# Patient Record
Sex: Male | Born: 1950
Health system: Southern US, Community
[De-identification: ages and names within clinical notes are randomized; demographics above are authoritative.]

## PROBLEM LIST (undated history)

## (undated) DIAGNOSIS — E785 Hyperlipidemia, unspecified: Secondary | ICD-10-CM

## (undated) DIAGNOSIS — I10 Essential (primary) hypertension: Secondary | ICD-10-CM

## (undated) HISTORY — DX: Hyperlipidemia, unspecified: E78.5

---

## 2013-11-16 ENCOUNTER — Ambulatory Visit: Payer: Self-pay | Admitting: Gastroenterology

## 2017-03-19 ENCOUNTER — Emergency Department (HOSPITAL_COMMUNITY)
Admission: EM | Admit: 2017-03-19 | Discharge: 2017-03-19 | Disposition: A | Discharge status: Home / Self Care | Payer: BLUE CROSS/BLUE SHIELD | Attending: Emergency Medicine | Admitting: Emergency Medicine

## 2017-03-19 ENCOUNTER — Encounter (HOSPITAL_COMMUNITY): Payer: Self-pay | Admitting: Emergency Medicine

## 2017-03-19 ENCOUNTER — Emergency Department (HOSPITAL_COMMUNITY): Payer: BLUE CROSS/BLUE SHIELD

## 2017-03-19 DIAGNOSIS — I10 Essential (primary) hypertension: Secondary | ICD-10-CM | POA: Insufficient documentation

## 2017-03-19 DIAGNOSIS — R42 Dizziness and giddiness: Secondary | ICD-10-CM | POA: Diagnosis not present

## 2017-03-19 DIAGNOSIS — R55 Syncope and collapse: Secondary | ICD-10-CM | POA: Diagnosis present

## 2017-03-19 HISTORY — DX: Essential (primary) hypertension: I10

## 2017-03-19 LAB — URINALYSIS, ROUTINE W REFLEX MICROSCOPIC
BILIRUBIN URINE: NEGATIVE
GLUCOSE, UA: NEGATIVE mg/dL
HGB URINE DIPSTICK: NEGATIVE
Ketones, ur: NEGATIVE mg/dL
NITRITE: NEGATIVE
PROTEIN: NEGATIVE mg/dL
Specific Gravity, Urine: 1.012 (ref 1.005–1.030)
Squamous Epithelial / LPF: NONE SEEN
pH: 6 (ref 5.0–8.0)

## 2017-03-19 LAB — BASIC METABOLIC PANEL
Anion gap: 10 (ref 5–15)
BUN: 11 mg/dL (ref 6–20)
CHLORIDE: 104 mmol/L (ref 101–111)
CO2: 23 mmol/L (ref 22–32)
CREATININE: 1 mg/dL (ref 0.61–1.24)
Calcium: 9.7 mg/dL (ref 8.9–10.3)
GFR calc Af Amer: >60 mL/min (ref >60)
GLUCOSE: 101 mg/dL — AB (ref 65–99)
POTASSIUM: 3.8 mmol/L (ref 3.5–5.1)
SODIUM: 137 mmol/L (ref 135–145)

## 2017-03-19 LAB — I-STAT TROPONIN, ED: TROPONIN I, POC: 0 ng/mL (ref 0.00–0.08)

## 2017-03-19 LAB — CBC WITH DIFFERENTIAL/PLATELET
Basophils Absolute: 0 10*3/uL (ref 0.0–0.1)
Basophils Relative: 0 %
EOS ABS: 0.3 10*3/uL (ref 0.0–0.7)
EOS PCT: 4 %
HCT: 41.1 % (ref 39.0–52.0)
Hemoglobin: 14.1 g/dL (ref 13.0–17.0)
LYMPHS ABS: 1.4 10*3/uL (ref 0.7–4.0)
LYMPHS PCT: 20 %
MCH: 29.3 pg (ref 26.0–34.0)
MCHC: 34.3 g/dL (ref 30.0–36.0)
MCV: 85.4 fL (ref 78.0–100.0)
MONO ABS: 0.6 10*3/uL (ref 0.1–1.0)
Monocytes Relative: 9 %
Neutro Abs: 4.7 10*3/uL (ref 1.7–7.7)
Neutrophils Relative %: 67 %
Platelets: 208 10*3/uL (ref 150–400)
RBC: 4.81 MIL/uL (ref 4.22–5.81)
RDW: 12.6 % (ref 11.5–15.5)
WBC: 7.1 10*3/uL (ref 4.0–10.5)

## 2017-03-19 MED ORDER — MECLIZINE HCL 25 MG PO TABS
25.0000 mg | ORAL_TABLET | Freq: Once | ORAL | Status: AC
  Administered 2017-03-19: 25 mg via ORAL
  Filled 2017-03-19: qty 1

## 2017-03-19 MED ORDER — SODIUM CHLORIDE 0.9 % IV BOLUS (SEPSIS)
1000.0000 mL | Freq: Once | INTRAVENOUS | Status: AC
  Administered 2017-03-19: 1000 mL via INTRAVENOUS

## 2017-03-19 MED ORDER — MECLIZINE HCL 25 MG PO TABS: 25.0000 mg | ORAL_TABLET | Freq: Three times a day (TID) | ORAL | 0 refills | Status: DC | PRN

## 2017-03-19 MED ORDER — GADOBENATE DIMEGLUMINE 529 MG/ML IV SOLN
18.0000 mL | Freq: Once | INTRAVENOUS | Status: AC | PRN
  Administered 2017-03-19: 18 mL via INTRAVENOUS

## 2017-03-19 NOTE — ED Notes (Signed)
Pt at MRI

## 2017-03-19 NOTE — Discharge Instructions (Signed)
Your dizziness today did not appear to be from a stroke. We would like you to follow-up with the ear nose and throat team for further management. Please take the medication to help with her dizziness. Please stay hydrated and get some rest. If any symptoms change or worsen, please return to the nearest emergency department.

## 2017-03-19 NOTE — ED Notes (Signed)
Pt ambulated hall without difficulty and denied feeling dizzy

## 2017-03-19 NOTE — ED Provider Notes (Signed)
[facility]-EMERGENCY DEPT Provider Note   CSN: [id number] Arrival date & time: 03/19/17  1811     History   Chief Complaint Chief Complaint  Patient presents with  . Near Syncope    HPI Zachary Taylor is a 66 y.o. male with PMH/o HTN who presents via EMS with dizziness and near syncope. Patient reports experiencing some mild dizziness that began this afternoon; unclear at what time it started. Patient also reports that he "felt hot., almost like he was sweating" Initially after symptoms onset, he had a customer who told he looked like he didn't feel well. 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 Per EMS, witnessed noted no LOC. Patient reports that the dizziness "feels like I'm flying, like everything is upside down." He states it is worse when he moves his head. He denies any CP, SOB, vomiting. He denies any fall or trauma. He denies any recent travel, immobilization, recent surgery or history of blood clots. He states he does not smoke.   The history is provided by the patient.    No past medical history on file.  There are no active problems to display for this patient.   No past surgical history on file.     Home Medications    Prior to Admission medications   Not on File    Family History No family history on file.  Social History Social History  Substance Use Topics  . Smoking status: Not on file  . Smokeless tobacco: Not on file  . Alcohol use Not on file     Allergies   Patient has no allergy information on record.   Review of Systems Review of Systems  Constitutional: Positive for diaphoresis. Negative for chills and fever.  HENT: Negative for congestion, rhinorrhea and sore throat.   Eyes: Negative for visual disturbance.  Respiratory: Negative for cough and shortness of breath.   Cardiovascular: Negative for chest pain.  Gastrointestinal: Negative for abdominal pain, diarrhea, nausea and vomiting.    Genitourinary: Negative for dysuria and hematuria.  Musculoskeletal: Negative for back pain and neck pain.  Skin: Negative for rash.  Neurological: Positive for dizziness. Negative for weakness, numbness and headaches.  Psychiatric/Behavioral: Negative for confusion.  All other systems reviewed and are negative.    Physical Exam Updated Vital Signs BP (!) 150/101 (BP Location: Right Arm)   Pulse 88   Temp 98.3 F (36.8 C) (Oral)   Resp 17   Ht 6' (1.829 m)   Wt 81.6 kg   SpO2 100%   BMI 24.41 kg/m   Physical Exam  Constitutional: He is oriented to person, place, and time. He appears well-developed and well-nourished.  HENT:  Head: Normocephalic and atraumatic.  Mouth/Throat: Oropharynx is clear and moist and mucous membranes are normal.  Eyes: Conjunctivae and lids are normal. Pupils are equal, round, and reactive to light. Right eye exhibits nystagmus. Left eye exhibits nystagmus.  Horizontal nystagmus to the left  Neck: Full passive range of motion without pain.  Cardiovascular: Normal rate, regular rhythm, normal heart sounds and normal pulses.  Exam reveals no gallop and no friction rub.   No murmur heard. Pulmonary/Chest: Effort normal and breath sounds normal.  Abdominal: Soft. Normal appearance. There is no tenderness. There is no rigidity and no guarding.  Musculoskeletal: Normal range of motion.  Neurological: He is alert and oriented to person, place, and time.  Cranial nerves III-XII intact Follows commands, Moves all extremities  5/5 strength to BUE and BLE  Sensation grossly Normal finger to nose. No dysdiadochokinesia. No pronator drift. No slurred speech. No facial droop.   Skin: Skin is warm and dry. Capillary refill takes less than 2 seconds.  Psychiatric: He has a normal mood and affect. His speech is normal.  Nursing note and vitals reviewed.    ED Treatments / Results  Labs (all labs ordered are listed, but only abnormal results are  displayed) Labs Reviewed - No data to display  EKG  EKG Interpretation  Date/Time:  Wednesday March 19 2017 18:22:26 EDT Ventricular Rate:  77 PR Interval:    QRS Duration: 81 QT Interval:  370 QTC Calculation: 419 R Axis:   93 Text Interpretation:  Sinus rhythm Atrial premature complex Right axis deviation Borderline T abnormalities, lateral leads Borderline ST elevation, anterior leads No prior ECG for comparison No STEMI Confirmed by Rush Landmark MD, CHRISTOPHER ([id number]) on 03/19/2017 6:44:12 PM       Radiology No results found.  Procedures Procedures (including critical care time)  Medications Ordered in ED Medications - No data to display   Initial Impression / Assessment and Plan / ED Course  I have reviewed the triage vital signs and the nursing notes.  Pertinent labs & imaging results that were available during my care of the patient were reviewed by me and considered in my medical decision making (see chart for details).     66 yo M with PMH/o HTN who presents with dizziness that began this afternoon. Unclear at what time symptoms started, though further discussion with his brother reveals that patient called him this morning and told he was dizzy. Patient has never had a history of vertigo and reports feeling at his normal baseline yesterday. Concern for vertigo vs infectious etiology vs TIA/CVA. Plan to check basic labs including CBC, BMP, Trop, UA, CT head. Will give IVF and meclizine for symptomatic relief.   Labs and imaging reviewed. No clear sign of infectious etiology. CT negative for any mass or hemorrhage. Attending discussed with neuro. Will check MRI for rule out CVA..   Signed out to [facility] NP at shift change. Pending MRI and resutls    Final Clinical Impressions(s) / ED Diagnoses   Final diagnoses:  None    New Prescriptions New Prescriptions   No medications on file     Westley Foots, [place] 03/20/17 [id number] [place], [place] 03/20/17  1931    Canary Brim Tegeler, MD 03/23/17 [phone]

## 2017-06-02 IMAGING — CT CT HEAD W/O CM
4 series · 16 of 47 positions shown, 18 images · non-contrast
Comparison: None.

CLINICAL DATA: Unsteady gait and dizziness

EXAM:
CT HEAD WITHOUT CONTRAST
TECHNIQUE: Contiguous axial images were obtained from the base of the skull
through the vertex without intravenous contrast.

[Series 3: head without · axial · non-contrast · 0.42mm/px · z∈[-95,+25]mm · 7 of 34 slices shown, 9 images]
[im 5/34  brain]
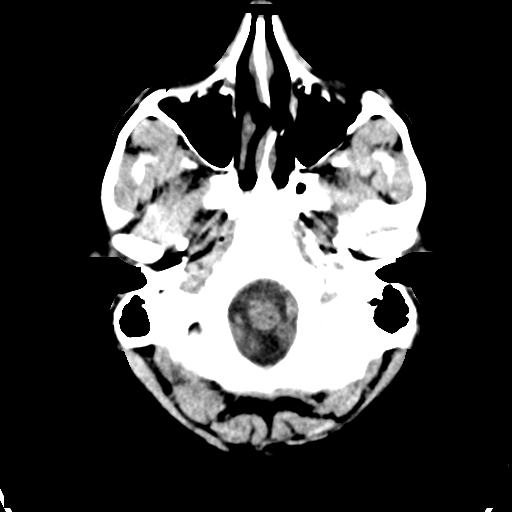
[im 5/34  bone]
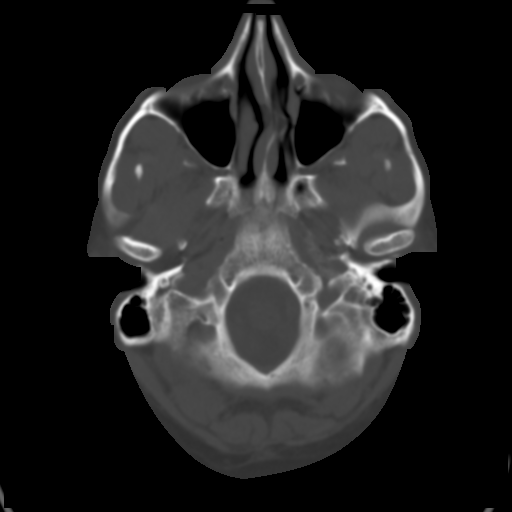
[im 9/34  brain]
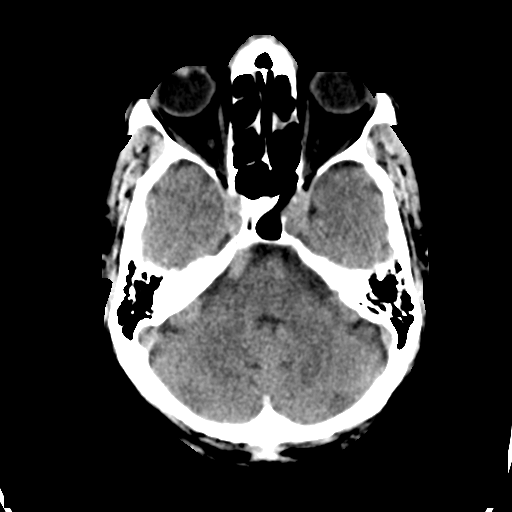
[im 13/34  brain]
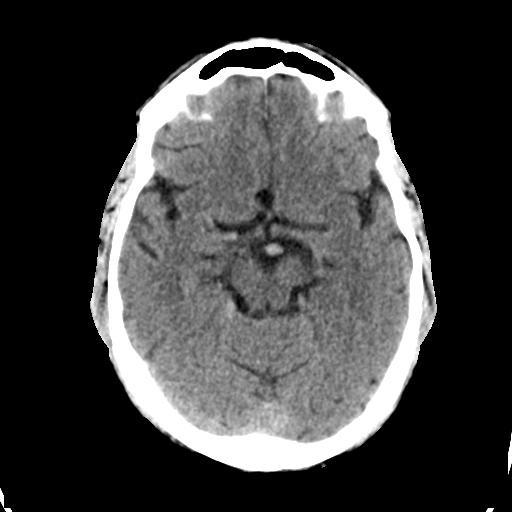
[im 17/34  brain]
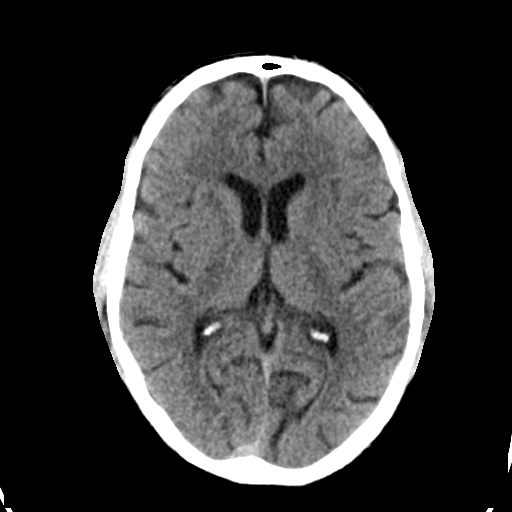
[im 21/34  brain]
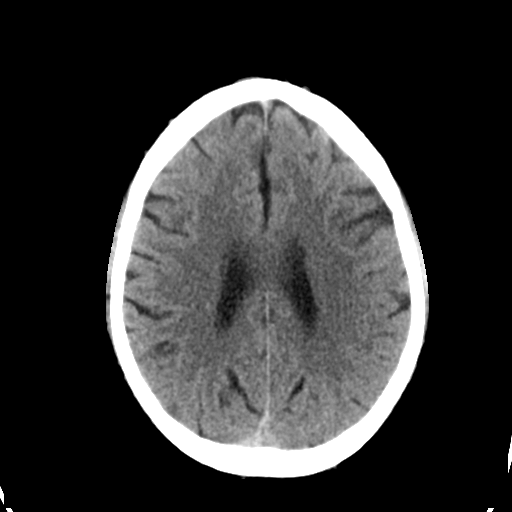
[im 21/34  bone]
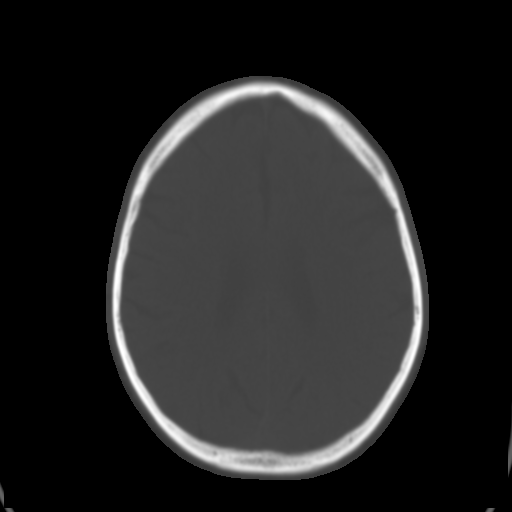
[im 25/34  brain]
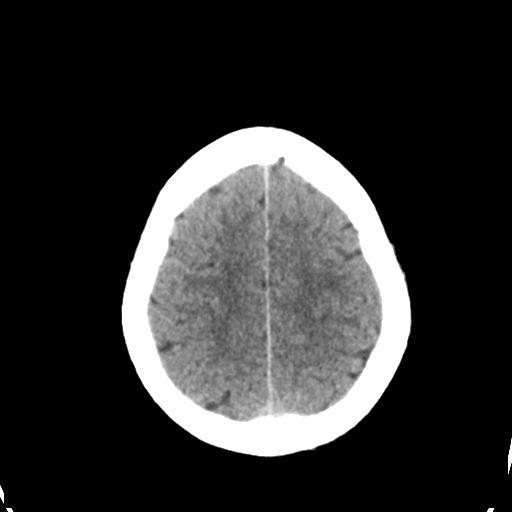
[im 29/34  brain]
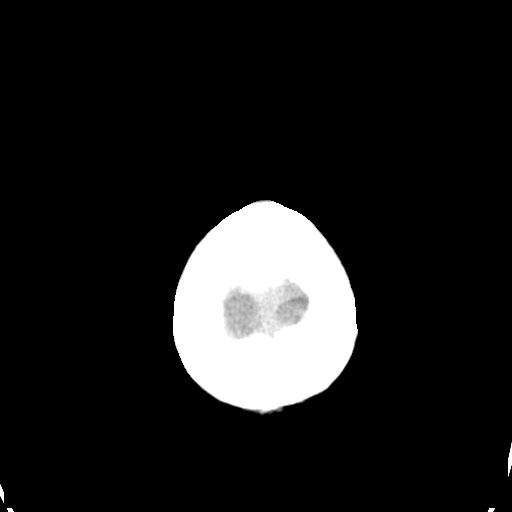

[Series 4: head bone · axial · 0.42mm/px · z∈[-99,-67]mm · 3 of 83 slices shown]
[im 9/83  bone]
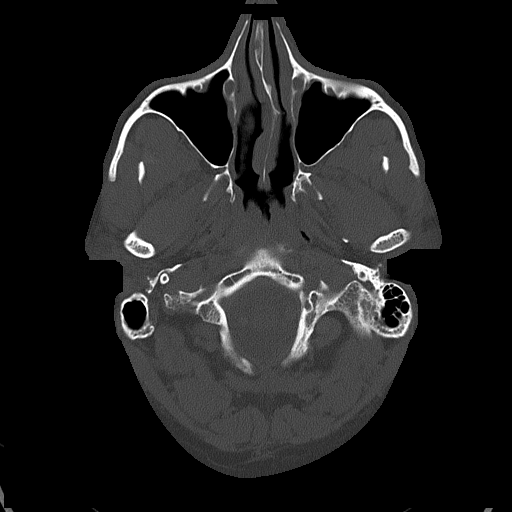
[im 17/83  bone]
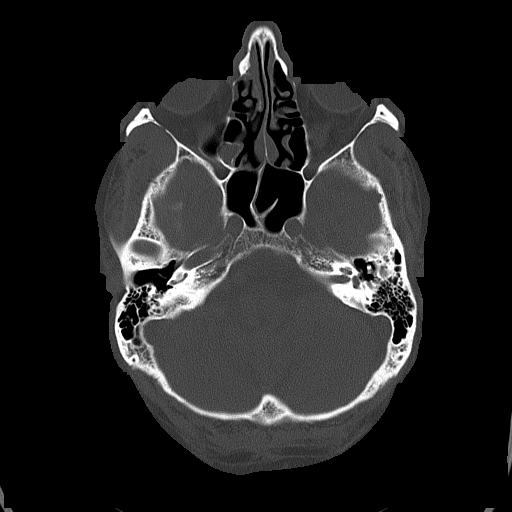
[im 25/83  bone]
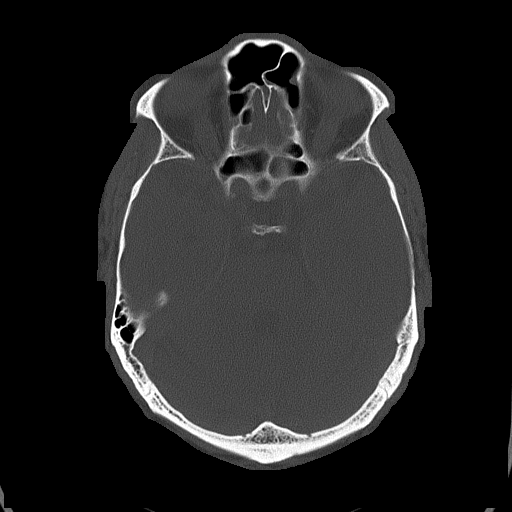

[Series 5: head without cor · coronal · non-contrast · 0.32mm/px · 3 of 71 slices shown]
[im 24/71  brain]
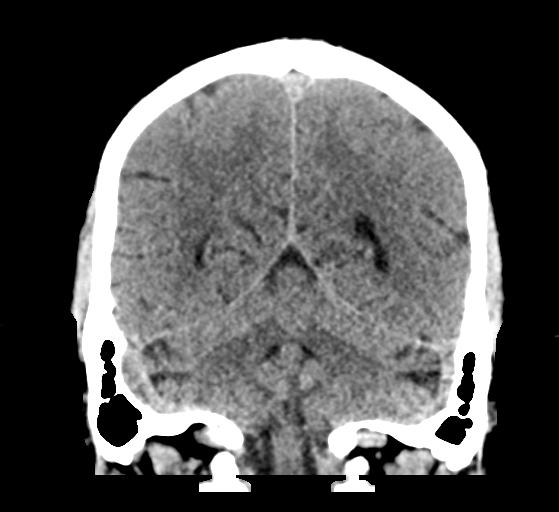
[im 32/71  brain]
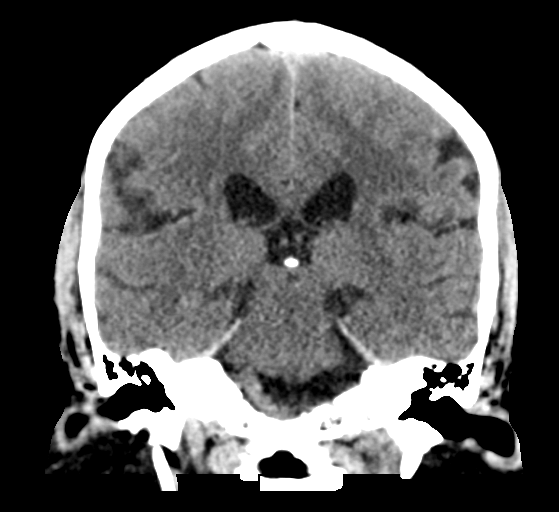
[im 39/71  brain]
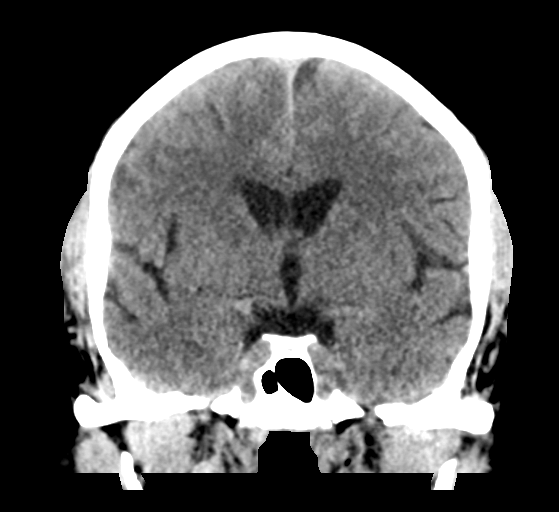

[Series 6: head without sag · sagittal · non-contrast · 0.31mm/px · 3 of 62 slices shown]
[im 21/62  brain]
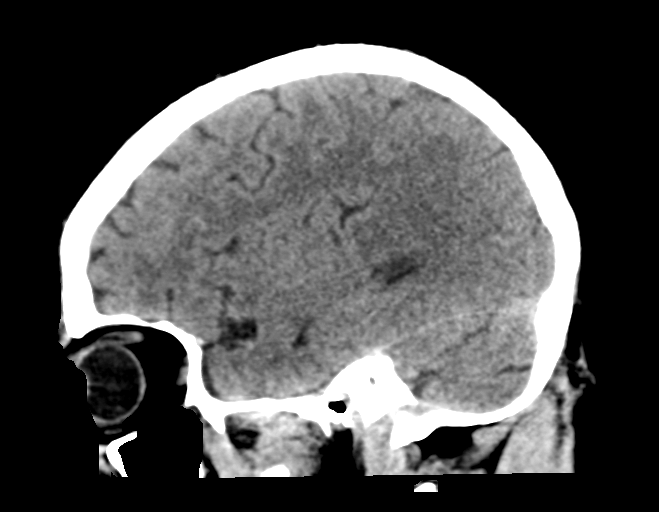
[im 31/62  brain]
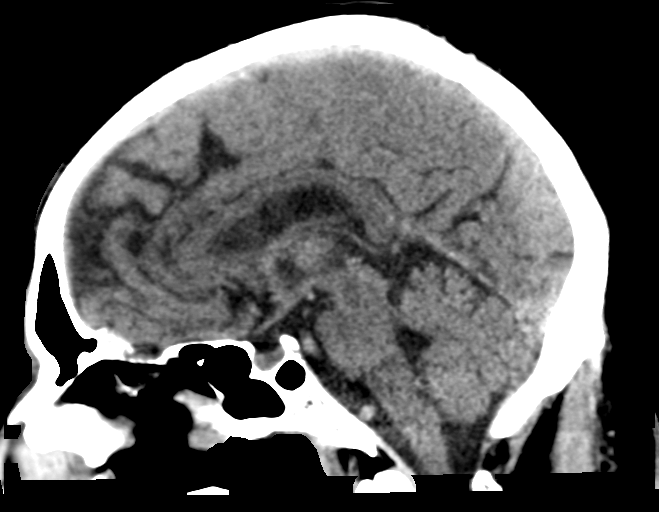
[im 41/62  brain]
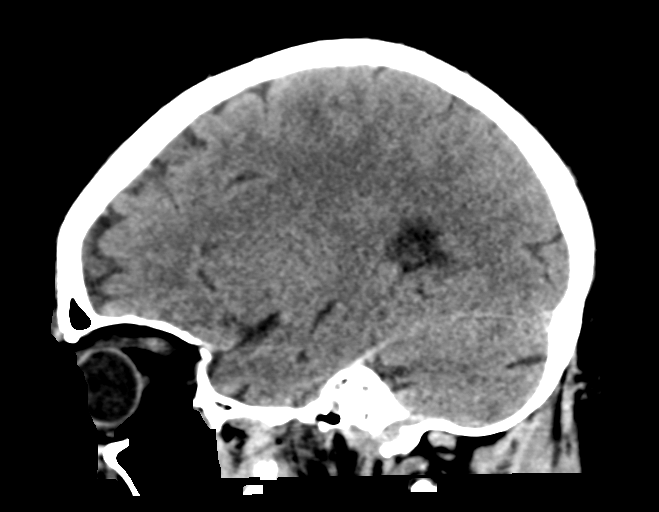

[16 of 47 positions shown; findings below may reference images not displayed]

FINDINGS: Brain: The ventricles are in the midline without mass effect or
shift. They are normal in size and configuration for age. No
extra-axial fluid collections are identified. No CT findings for
acute hemispheric infarction or intracranial hemorrhage. No mass
lesions. The brainstem and cerebellum are unremarkable.

Vascular: No hyperdense vessels or aneurysm.

Skull: No skull fracture or bone lesion.

Sinuses/Orbits: Scattered ethmoid sinus disease. The mastoid air
cells and middle ear cavities are clear.

Other: No scalp lesions or hematoma.
IMPRESSION: No acute intracranial findings or mass lesions.

## 2018-03-18 DIAGNOSIS — M659 Synovitis and tenosynovitis, unspecified: Secondary | ICD-10-CM | POA: Diagnosis not present

## 2018-03-18 DIAGNOSIS — F5101 Primary insomnia: Secondary | ICD-10-CM | POA: Diagnosis not present

## 2018-03-18 DIAGNOSIS — I10 Essential (primary) hypertension: Secondary | ICD-10-CM | POA: Diagnosis not present

## 2018-04-03 DIAGNOSIS — M654 Radial styloid tenosynovitis [de Quervain]: Secondary | ICD-10-CM | POA: Diagnosis not present

## 2018-04-06 DIAGNOSIS — M654 Radial styloid tenosynovitis [de Quervain]: Secondary | ICD-10-CM | POA: Diagnosis not present

## 2018-05-14 DIAGNOSIS — M654 Radial styloid tenosynovitis [de Quervain]: Secondary | ICD-10-CM | POA: Diagnosis not present

## 2018-11-05 DIAGNOSIS — I10 Essential (primary) hypertension: Secondary | ICD-10-CM | POA: Diagnosis not present

## 2018-11-05 DIAGNOSIS — Z0000 Encounter for general adult medical examination without abnormal findings: Secondary | ICD-10-CM | POA: Diagnosis not present

## 2018-11-05 DIAGNOSIS — R42 Dizziness and giddiness: Secondary | ICD-10-CM | POA: Diagnosis not present

## 2018-11-05 DIAGNOSIS — F5101 Primary insomnia: Secondary | ICD-10-CM | POA: Diagnosis not present

## 2018-11-09 DIAGNOSIS — R42 Dizziness and giddiness: Secondary | ICD-10-CM | POA: Diagnosis not present

## 2018-11-09 DIAGNOSIS — R531 Weakness: Secondary | ICD-10-CM | POA: Diagnosis not present

## 2018-11-09 DIAGNOSIS — E119 Type 2 diabetes mellitus without complications: Secondary | ICD-10-CM | POA: Diagnosis not present

## 2018-11-09 DIAGNOSIS — E7849 Other hyperlipidemia: Secondary | ICD-10-CM | POA: Diagnosis not present

## 2018-11-09 DIAGNOSIS — E034 Atrophy of thyroid (acquired): Secondary | ICD-10-CM | POA: Diagnosis not present

## 2018-11-09 DIAGNOSIS — R5381 Other malaise: Secondary | ICD-10-CM | POA: Diagnosis not present

## 2019-02-01 DIAGNOSIS — M659 Synovitis and tenosynovitis, unspecified: Secondary | ICD-10-CM | POA: Diagnosis not present

## 2019-02-01 DIAGNOSIS — R42 Dizziness and giddiness: Secondary | ICD-10-CM | POA: Diagnosis not present

## 2019-02-01 DIAGNOSIS — F5101 Primary insomnia: Secondary | ICD-10-CM | POA: Diagnosis not present

## 2019-02-01 DIAGNOSIS — I10 Essential (primary) hypertension: Secondary | ICD-10-CM | POA: Diagnosis not present

## 2019-06-10 DIAGNOSIS — I10 Essential (primary) hypertension: Secondary | ICD-10-CM | POA: Diagnosis not present

## 2019-06-10 DIAGNOSIS — F5101 Primary insomnia: Secondary | ICD-10-CM | POA: Diagnosis not present

## 2019-06-10 DIAGNOSIS — L2081 Atopic neurodermatitis: Secondary | ICD-10-CM | POA: Diagnosis not present

## 2019-06-10 DIAGNOSIS — M659 Synovitis and tenosynovitis, unspecified: Secondary | ICD-10-CM | POA: Diagnosis not present

## 2019-07-12 DIAGNOSIS — F5101 Primary insomnia: Secondary | ICD-10-CM | POA: Diagnosis not present

## 2019-07-12 DIAGNOSIS — L2081 Atopic neurodermatitis: Secondary | ICD-10-CM | POA: Diagnosis not present

## 2019-07-12 DIAGNOSIS — G5600 Carpal tunnel syndrome, unspecified upper limb: Secondary | ICD-10-CM | POA: Diagnosis not present

## 2019-07-12 DIAGNOSIS — M659 Synovitis and tenosynovitis, unspecified: Secondary | ICD-10-CM | POA: Diagnosis not present

## 2019-07-14 DIAGNOSIS — R42 Dizziness and giddiness: Secondary | ICD-10-CM | POA: Diagnosis not present

## 2019-11-05 DIAGNOSIS — Z03818 Encounter for observation for suspected exposure to other biological agents ruled out: Secondary | ICD-10-CM | POA: Diagnosis not present

## 2020-05-01 ENCOUNTER — Other Ambulatory Visit: Payer: Self-pay | Admitting: Internal Medicine

## 2020-05-08 ENCOUNTER — Telehealth: Payer: Self-pay | Admitting: *Deleted

## 2020-05-08 MED ORDER — MELOXICAM 15 MG PO TABS: 15.0000 mg | ORAL_TABLET | Freq: Every day | ORAL | 3 refills | Status: DC

## 2020-05-08 MED ORDER — GABAPENTIN 300 MG PO CAPS: 100.0000 mg | ORAL_CAPSULE | Freq: Three times a day (TID) | ORAL | 3 refills | Status: DC

## 2020-05-08 NOTE — Telephone Encounter (Signed)
Patient called and spoke with Dr. Juel Burrow and requested refill on Gabapentin and meloxicam. Per Dr. Juel Burrow okay to fill both with 3 refills. Rxs sent to pharmacy.

## 2020-05-19 ENCOUNTER — Encounter: Payer: Self-pay | Admitting: Internal Medicine

## 2020-05-19 ENCOUNTER — Other Ambulatory Visit: Payer: Self-pay

## 2020-05-19 ENCOUNTER — Ambulatory Visit (INDEPENDENT_AMBULATORY_CARE_PROVIDER_SITE_OTHER): Payer: Medicare HMO | Admitting: Internal Medicine

## 2020-05-19 VITALS — BP 148/78 | HR 101 | Wt 203.1 lb

## 2020-05-19 DIAGNOSIS — Z0000 Encounter for general adult medical examination without abnormal findings: Secondary | ICD-10-CM

## 2020-05-19 DIAGNOSIS — N529 Male erectile dysfunction, unspecified: Secondary | ICD-10-CM

## 2020-05-19 DIAGNOSIS — Z1211 Encounter for screening for malignant neoplasm of colon: Secondary | ICD-10-CM | POA: Diagnosis not present

## 2020-05-19 NOTE — Addendum Note (Signed)
Addended by: Corky Downs on: 05/19/2020 06:32 PM   Modules accepted: Level of Service

## 2020-05-19 NOTE — Progress Notes (Addendum)
Established Patient Office Visit  Subjective:  Patient ID: Zachary Taylor, male    DOB: 10-05-1951  Age: 69 y.o. MRN: [id number]  CC: No chief complaint on file.   HPI  Zachary Taylor presents for general medical checkup.  Denies chest pain or shortness of breath.  Past Medical History:  Diagnosis Date  . Hypertension     History reviewed. No pertinent surgical history.  History reviewed. No pertinent family history.  Social History   Socioeconomic History  . Marital status: Married    Spouse name: Not on file  . Number of children: Not on file  . Years of education: Not on file  . Highest education level: Not on file  Occupational History  . Not on file  Tobacco Use  . Smoking status: Never Smoker  . Smokeless tobacco: Never Used  Substance and Sexual Activity  . Alcohol use: No  . Drug use: No  . Sexual activity: Not on file  Other Topics Concern  . Not on file  Social History Narrative  . Not on file   Social Determinants of Health   Financial Resource Strain:   . Difficulty of Paying Living Expenses:   Food Insecurity:   . Worried About Programme researcher, broadcasting/film/video in the Last Year:   . Barista in the Last Year:   Transportation Needs:   . Freight forwarder (Medical):   Marland Kitchen Lack of Transportation (Non-Medical):   Physical Activity:   . Days of Exercise per Week:   . Minutes of Exercise per Session:   Stress:   . Feeling of Stress :   Social Connections:   . Frequency of Communication with Friends and Family:   . Frequency of Social Gatherings with Friends and Family:   . Attends Religious Services:   . Active Member of Clubs or Organizations:   . Attends Banker Meetings:   Marland Kitchen Marital Status:   Intimate Partner Violence:   . Fear of Current or Ex-Partner:   . Emotionally Abused:   Marland Kitchen Physically Abused:   . Sexually Abused:      Current Outpatient Medications:  .  gabapentin (NEURONTIN) 300 MG capsule, Take 1 capsule (300 mg total) by  mouth 3 (three) times daily., Disp: 90 capsule, Rfl: 3   No Known Allergies  ROS Review of Systems  Constitutional: Negative.   HENT: Negative.   Eyes: Negative.   Respiratory: Negative.   Cardiovascular: Negative.   Gastrointestinal: Negative.   Endocrine: Negative.   Genitourinary: Negative.   Musculoskeletal: Negative.   Allergic/Immunologic: Negative.   Neurological: Negative.   Hematological: Negative.   Psychiatric/Behavioral: Negative.       Objective:    Physical Exam  Constitutional: He is oriented to person, place, and time. He appears well-developed and well-nourished.  Eyes: Pupils are equal, round, and reactive to light.  Cardiovascular: Normal rate and regular rhythm.  Pulmonary/Chest: Effort normal and breath sounds normal.  Abdominal: Soft. Bowel sounds are normal.  Genitourinary:    Prostate normal.  Rectum:     No tenderness.   Musculoskeletal:        General: Normal range of motion.     Cervical back: Normal range of motion.  Neurological: He is alert and oriented to person, place, and time. No cranial nerve deficit. Coordination normal.  Skin: Skin is warm.  Psychiatric: He has a normal mood and affect.    BP (!) 148/78   Pulse (!) 101   Wt  203 lb 1.6 oz (92.1 kg)   BMI 27.55 kg/m  Wt Readings from Last 3 Encounters:  05/19/20 203 lb 1.6 oz (92.1 kg)  03/19/17 180 lb (81.6 kg)     Health Maintenance Due  Topic Date Due  . Hepatitis C Screening  Never done  . COVID-19 Vaccine (1) Never done  . TETANUS/TDAP  Never done  . COLONOSCOPY  Never done  . PNA vac Low Risk Adult (1 of 2 - PCV13) Never done    There are no preventive care reminders to display for this patient.  Lab Results  Component Value Date   TSH 2.11 05/23/2020   Lab Results  Component Value Date   WBC 4.8 05/23/2020   HGB 12.0 (L) 05/23/2020   HCT 38.4 (L) 05/23/2020   MCV 83.3 05/23/2020   PLT 270 05/23/2020   Lab Results  Component Value Date   NA 139  05/23/2020   K 4.6 05/23/2020   CO2 24 05/23/2020   GLUCOSE 108 (H) 05/23/2020   BUN 18 05/23/2020   CREATININE 1.04 05/23/2020   BILITOT 0.7 05/23/2020   AST 21 05/23/2020   ALT 21 05/23/2020   PROT 6.9 05/23/2020   CALCIUM 9.5 05/23/2020   ANIONGAP 10 03/19/2017   No results found for: CHOL No results found for: HDL No results found for: LDLCALC No results found for: TRIG No results found for: CHOLHDL No results found for: HGBA1C    Assessment & Plan:   Problem List Items Addressed This Visit    None    Visit Diagnoses    Screening for colon cancer    -  Primary   Relevant Orders   CT VIRTUAL COLONOSCOPY SCREENING   Erectile dysfunction, unspecified erectile dysfunction type       Relevant Orders   Testosterone   CBC with Differential/Platelet (Completed)   COMPLETE METABOLIC PANEL WITH GFR (Completed)   Lipid Panel With LDL/HDL Ratio   TSH (Completed)   PSA (Completed)   Testosterone (Completed)   Annual physical exam       Relevant Orders   PSA   TSH   Lipid Panel With LDL/HDL Ratio   COMPLETE METABOLIC PANEL WITH GFR   CBC with Differential/Platelet   CBC with Differential/Platelet (Completed)   COMPLETE METABOLIC PANEL WITH GFR (Completed)   Lipid Panel With LDL/HDL Ratio   TSH (Completed)   PSA (Completed)   Testosterone (Completed)   Routine general medical examination at a health care facility          No orders of the defined types were placed in this encounter.  1. Screening for colon cancer Rectal examination was negative. - CT VIRTUAL COLONOSCOPY SCREENING; Future  2. Erectile dysfunction, unspecified erectile dysfunction type We will check testosterone level. - Testosterone  3. Annual physical exam Annual physical exam is normal.  Patient is overweight so he was advised to lose weight. - PSA - TSH - Lipid Panel With LDL/HDL Ratio - COMPLETE METABOLIC PANEL WITH GFR - CBC with Differential/Platelet  Follow-up: Return in about 5  years (around 05/19/2025).    Cletis Athens, MD

## 2020-05-23 ENCOUNTER — Other Ambulatory Visit: Payer: Self-pay

## 2020-05-23 ENCOUNTER — Other Ambulatory Visit (INDEPENDENT_AMBULATORY_CARE_PROVIDER_SITE_OTHER): Payer: Medicare HMO

## 2020-05-23 DIAGNOSIS — Z0000 Encounter for general adult medical examination without abnormal findings: Secondary | ICD-10-CM

## 2020-05-23 DIAGNOSIS — N529 Male erectile dysfunction, unspecified: Secondary | ICD-10-CM | POA: Diagnosis not present

## 2020-05-23 LAB — COMPLETE METABOLIC PANEL WITH GFR
AG Ratio: 1.7 (calc) (ref 1.0–2.5)
ALT: 21 U/L (ref 9–46)
AST: 21 U/L (ref 10–35)
Albumin: 4.3 g/dL (ref 3.6–5.1)
Alkaline phosphatase (APISO): 52 U/L (ref 35–144)
BUN: 18 mg/dL (ref 7–25)
CO2: 24 mmol/L (ref 20–32)
Calcium: 9.5 mg/dL (ref 8.6–10.3)
Chloride: 105 mmol/L (ref 98–110)
Creat: 1.04 mg/dL (ref 0.70–1.25)
GFR, Est African American: 86 mL/min/{1.73_m2} (ref >=60)
GFR, Est Non African American: 74 mL/min/{1.73_m2} (ref >=60)
Globulin: 2.6 g/dL (calc) (ref 1.9–3.7)
Glucose, Bld: 108 mg/dL — ABNORMAL HIGH (ref 65–99)
Potassium: 4.6 mmol/L (ref 3.5–5.3)
Sodium: 139 mmol/L (ref 135–146)
Total Bilirubin: 0.7 mg/dL (ref 0.2–1.2)
Total Protein: 6.9 g/dL (ref 6.1–8.1)

## 2020-05-23 LAB — CBC WITH DIFFERENTIAL/PLATELET
Absolute Monocytes: 504 cells/uL (ref 200–950)
Basophils Absolute: 38 cells/uL (ref 0–200)
Basophils Relative: 0.8 %
Eosinophils Absolute: 259 cells/uL (ref 15–500)
Eosinophils Relative: 5.4 %
HCT: 38.4 % — ABNORMAL LOW (ref 38.5–50.0)
Hemoglobin: 12 g/dL — ABNORMAL LOW (ref 13.2–17.1)
Lymphs Abs: 1258 cells/uL (ref 850–3900)
MCH: 26 pg — ABNORMAL LOW (ref 27.0–33.0)
MCHC: 31.3 g/dL — ABNORMAL LOW (ref 32.0–36.0)
MCV: 83.3 fL (ref 80.0–100.0)
MPV: 11.3 fL (ref 7.5–12.5)
Monocytes Relative: 10.5 %
Neutro Abs: 2741 cells/uL (ref 1500–7800)
Neutrophils Relative %: 57.1 %
Platelets: 270 10*3/uL (ref 140–400)
RBC: 4.61 10*6/uL (ref 4.20–5.80)
RDW: 13.1 % (ref 11.0–15.0)
Total Lymphocyte: 26.2 %
WBC: 4.8 10*3/uL (ref 3.8–10.8)

## 2020-05-23 LAB — TESTOSTERONE: Testosterone: 385 ng/dL (ref 250–827)

## 2020-05-23 LAB — TSH: TSH: 2.11 mIU/L (ref 0.40–4.50)

## 2020-05-23 LAB — PSA: PSA: 1.9 ng/mL (ref <=4.0)

## 2020-05-23 NOTE — Addendum Note (Signed)
Addended by: Jobie Quaker on: 05/23/2020 09:23 AM   Modules accepted: Orders

## 2020-05-30 ENCOUNTER — Encounter: Payer: Self-pay | Admitting: Internal Medicine

## 2020-05-30 NOTE — Addendum Note (Signed)
Addended by: Corky Downs on: 05/30/2020 03:45 PM   Modules accepted: Level of Service

## 2020-06-09 ENCOUNTER — Other Ambulatory Visit: Payer: Self-pay | Admitting: *Deleted

## 2020-06-09 DIAGNOSIS — Z1211 Encounter for screening for malignant neoplasm of colon: Secondary | ICD-10-CM

## 2020-07-04 ENCOUNTER — Other Ambulatory Visit: Payer: Self-pay | Admitting: *Deleted

## 2020-07-04 DIAGNOSIS — Z1211 Encounter for screening for malignant neoplasm of colon: Secondary | ICD-10-CM

## 2020-07-12 ENCOUNTER — Encounter: Payer: Self-pay | Admitting: *Deleted

## 2020-08-01 ENCOUNTER — Other Ambulatory Visit: Payer: Self-pay | Admitting: Internal Medicine

## 2020-09-11 ENCOUNTER — Other Ambulatory Visit: Payer: Self-pay | Admitting: *Deleted

## 2020-09-11 MED ORDER — CYCLOBENZAPRINE HCL 10 MG PO TABS: 10.0000 mg | ORAL_TABLET | Freq: Two times a day (BID) | ORAL | 0 refills | Status: DC | PRN

## 2020-09-11 MED ORDER — CYCLOBENZAPRINE HCL 10 MG PO TABS
10.0000 mg | ORAL_TABLET | Freq: Two times a day (BID) | ORAL | 0 refills | Status: DC | PRN
Start: 2020-09-11 — End: 2020-09-11

## 2020-09-11 NOTE — Addendum Note (Signed)
Addended by: Jobie Quaker on: 09/11/2020 04:35 PM   Modules accepted: Orders

## 2020-09-29 ENCOUNTER — Other Ambulatory Visit: Payer: Self-pay

## 2020-10-03 ENCOUNTER — Ambulatory Visit (INDEPENDENT_AMBULATORY_CARE_PROVIDER_SITE_OTHER): Payer: Medicare HMO | Admitting: Internal Medicine

## 2020-10-03 ENCOUNTER — Encounter: Payer: Self-pay | Admitting: Internal Medicine

## 2020-10-03 ENCOUNTER — Other Ambulatory Visit: Payer: Self-pay | Admitting: Internal Medicine

## 2020-10-03 ENCOUNTER — Other Ambulatory Visit: Payer: Self-pay

## 2020-10-03 VITALS — BP 142/95 | HR 107 | Ht 70.0 in | Wt 211.5 lb

## 2020-10-03 DIAGNOSIS — J029 Acute pharyngitis, unspecified: Secondary | ICD-10-CM | POA: Insufficient documentation

## 2020-10-03 DIAGNOSIS — Z20822 Contact with and (suspected) exposure to covid-19: Secondary | ICD-10-CM | POA: Diagnosis not present

## 2020-10-03 DIAGNOSIS — I10 Essential (primary) hypertension: Secondary | ICD-10-CM | POA: Diagnosis not present

## 2020-10-03 DIAGNOSIS — G629 Polyneuropathy, unspecified: Secondary | ICD-10-CM | POA: Diagnosis not present

## 2020-10-03 DIAGNOSIS — N529 Male erectile dysfunction, unspecified: Secondary | ICD-10-CM | POA: Diagnosis not present

## 2020-10-03 DIAGNOSIS — J028 Acute pharyngitis due to other specified organisms: Secondary | ICD-10-CM

## 2020-10-03 DIAGNOSIS — E8881 Metabolic syndrome: Secondary | ICD-10-CM

## 2020-10-03 LAB — POC COVID19 BINAXNOW: SARS Coronavirus 2 Ag: NEGATIVE

## 2020-10-03 MED ORDER — AZITHROMYCIN 250 MG PO TABS: ORAL_TABLET | ORAL | 0 refills | Status: DC

## 2020-10-03 MED ORDER — PREGABALIN 50 MG PO CAPS: 50.0000 mg | ORAL_CAPSULE | Freq: Three times a day (TID) | ORAL | 1 refills | Status: DC

## 2020-10-03 NOTE — Assessment & Plan Note (Addendum)
Patient complains of myalgia muscle ache.  His neurological examination was normal.  Neck was supple.  His MRI and CT scan in the past were reviewed.  No abnormality was detected.  His overall reflexes are slightly decreased vibration sense was slightly decreased in the lower legs as compared with the forehead.  Sensation was normal, touch and pain proprioception was normal.  Patient was started on Lyrica 50 mg p.o. 3 times a day.  He was also told to have blood drawn for B12 level.  He was advised not to drink city water.  We will also check Fana, serum protein electrophoresis.  He was advised to get a neuro evaluation to rule out neuropathy.

## 2020-10-03 NOTE — Assessment & Plan Note (Signed)
-   Today, the patient's blood pressure is well managed on losartan. - The patient will continue the current treatment regimen.  - I encouraged the patient to eat a low-sodium diet to help control blood pressure. - I encouraged the patient to live an active lifestyle and complete activities that increases heart rate to 85% target heart rate at least 5 times per week for one hour.     

## 2020-10-03 NOTE — Assessment & Plan Note (Signed)
Advised to use Viagra or Viagra like products.  Testosterone level was okay.

## 2020-10-03 NOTE — Assessment & Plan Note (Signed)
We performed Covid test on the patient.  It was negative.  Patient throat was congested and reddish looking.  Started on Z-Pak as directed patient was also advised to take saline gargles 3-4 times a day.

## 2020-10-03 NOTE — Assessment & Plan Note (Signed)
Patient was advised to lose weight 

## 2020-10-03 NOTE — Assessment & Plan Note (Signed)
Covid test was negative in the office

## 2020-10-03 NOTE — Progress Notes (Signed)
Established Patient Office Visit  SUBJECTIVE:  Subjective  Patient ID: Zachary Taylor, male    DOB: 06/14/1951  Age: 69 y.o. MRN: [id number]  CC:  Chief Complaint  Patient presents with  . Sore Throat  . Cough  . Leg Pain    HPI Zachary Taylor is a 69 y.o. male presenting today for an evaluation of his sore throat, cough, and leg pain.   Ref Range & Units 10/12/2020 15:38  SARS Coronavirus 2 Ag Negative Negative    He has a sore throat and a cough, which developed about a week ago.  He has been gargling with salt water without much relief.  He does not have any fever chills lymphadenopathy.  There is no history of infection to nearby relatives.  He also complains of leg pain for which he treats with gabapentin.  I gave him muscle relaxant in the past without much relief.   Past Medical History:  Diagnosis Date  . Hypertension     History reviewed. No pertinent surgical history.  History reviewed. No pertinent family history.  Social History   Socioeconomic History  . Marital status: Married    Spouse name: Not on file  . Number of children: Not on file  . Years of education: Not on file  . Highest education level: Not on file  Occupational History  . Not on file  Tobacco Use  . Smoking status: Never Smoker  . Smokeless tobacco: Never Used  Substance and Sexual Activity  . Alcohol use: No  . Drug use: No  . Sexual activity: Not on file  Other Topics Concern  . Not on file  Social History Narrative  . Not on file   Social Determinants of Health   Financial Resource Strain:   . Difficulty of Paying Living Expenses: Not on file  Food Insecurity:   . Worried About Programme researcher, broadcasting/film/videounning Out of Food in the Last Year: Not on file  . Ran Out of Food in the Last Year: Not on file  Transportation Needs:   . Lack of Transportation (Medical): Not on file  . Lack of Transportation (Non-Medical): Not on file  Physical Activity:   . Days of Exercise per Week: Not on file  . Minutes of  Exercise per Session: Not on file  Stress:   . Feeling of Stress : Not on file  Social Connections:   . Frequency of Communication with Friends and Family: Not on file  . Frequency of Social Gatherings with Friends and Family: Not on file  . Attends Religious Services: Not on file  . Active Member of Clubs or Organizations: Not on file  . Attends BankerClub or Organization Meetings: Not on file  . Marital Status: Not on file  Intimate Partner Violence:   . Fear of Current or Ex-Partner: Not on file  . Emotionally Abused: Not on file  . Physically Abused: Not on file  . Sexually Abused: Not on file     Current Outpatient Medications:  .  cyclobenzaprine (FLEXERIL) 10 MG tablet, Take 1 tablet (10 mg total) by mouth 2 (two) times daily as needed for muscle spasms., Disp: 60 tablet, Rfl: 0 .  losartan-hydrochlorothiazide (HYZAAR) 100-25 MG tablet, Take 1 tablet by mouth once daily, Disp: 90 tablet, Rfl: 0 .  azithromycin (ZITHROMAX) 250 MG tablet, 2 tab po daily  For 3 days, Disp: 6 tablet, Rfl: 0 .  pregabalin (LYRICA) 50 MG capsule, Take 1 capsule (50 mg total) by mouth 3 (three) times  daily., Disp: 90 capsule, Rfl: 1   No Known Allergies  ROS Review of Systems  Constitutional: Positive for fatigue.  HENT: Positive for sore throat.   Eyes: Negative.   Respiratory: Positive for cough.   Cardiovascular: Negative.  Negative for chest pain.  Gastrointestinal: Negative.   Endocrine: Negative.   Genitourinary: Negative.   Musculoskeletal: Positive for myalgias.  Skin: Negative.   Allergic/Immunologic: Negative.   Neurological: Negative.   Hematological: Negative.   Psychiatric/Behavioral: Negative.   All other systems reviewed and are negative.    OBJECTIVE:    Physical Exam Vitals reviewed.  Constitutional:      Appearance: Normal appearance.  HENT:     Mouth/Throat:     Mouth: Mucous membranes are moist.     Pharynx: Oropharyngeal exudate and posterior oropharyngeal erythema  present.  Eyes:     Pupils: Pupils are equal, round, and reactive to light.  Neck:     Vascular: No carotid bruit.  Cardiovascular:     Rate and Rhythm: Normal rate and regular rhythm.     Pulses: Normal pulses.     Heart sounds: Normal heart sounds.  Pulmonary:     Effort: Pulmonary effort is normal.     Breath sounds: Normal breath sounds.  Abdominal:     General: Bowel sounds are normal.     Palpations: Abdomen is soft. There is no hepatomegaly, splenomegaly or mass.     Tenderness: There is no abdominal tenderness.     Hernia: No hernia is present.  Musculoskeletal:     Cervical back: Neck supple.     Right lower leg: No edema.     Left lower leg: No edema.  Skin:    Findings: No rash.  Neurological:     Mental Status: He is alert and oriented to person, place, and time.     Motor: No weakness.  Psychiatric:        Mood and Affect: Mood normal.        Behavior: Behavior normal.     BP (!) 142/95   Pulse (!) 107   Ht 5\' 10"  (1.778 m)   Wt 211 lb 8 oz (95.9 kg)   BMI 30.35 kg/m  Wt Readings from Last 3 Encounters:  10/03/20 211 lb 8 oz (95.9 kg)  05/19/20 203 lb 1.6 oz (92.1 kg)  03/19/17 180 lb (81.6 kg)    Health Maintenance Due  Topic Date Due  . Hepatitis C Screening  Never done  . COVID-19 Vaccine (1) Never done  . TETANUS/TDAP  Never done  . COLONOSCOPY  Never done  . PNA vac Low Risk Adult (1 of 2 - PCV13) Never done  . INFLUENZA VACCINE  Never done    There are no preventive care reminders to display for this patient.  CBC Latest Ref Rng & Units 05/23/2020 03/19/2017  WBC 3.8 - 10.8 Thousand/uL 4.8 7.1  Hemoglobin 13.2 - 17.1 g/dL 12.0(L) 14.1  Hematocrit 38 - 50 % 38.4(L) 41.1  Platelets 140 - 400 Thousand/uL 270 208   CMP Latest Ref Rng & Units 05/23/2020 03/19/2017  Glucose 65 - 99 mg/dL 03/21/2017) 782(U)  BUN 7 - 25 mg/dL 18 11  Creatinine 235(T - 1.25 mg/dL 6.14 4.31  Sodium 5.40 - 146 mmol/L 139 137  Potassium 3.5 - 5.3 mmol/L 4.6 3.8  Chloride  98 - 110 mmol/L 105 104  CO2 20 - 32 mmol/L 24 23  Calcium 8.6 - 10.3 mg/dL 9.5 9.7  Total Protein 6.1 -  8.1 g/dL 6.9 -  Total Bilirubin 0.2 - 1.2 mg/dL 0.7 -  AST 10 - 35 U/L 21 -  ALT 9 - 46 U/L 21 -    Lab Results  Component Value Date   TSH 2.11 05/23/2020   Lab Results  Component Value Date   ANIONGAP 10 03/19/2017   No results found for: CHOL, HDL, LDLCALC, CHOLHDL No results found for: TRIG No results found for: HGBA1C    ASSESSMENT & PLAN:   Problem List Items Addressed This Visit      Cardiovascular and Mediastinum   Essential hypertension    - Today, the patient's blood pressure is well managed on losartan. - The patient will continue the current treatment regimen.  - I encouraged the patient to eat a low-sodium diet to help control blood pressure. - I encouraged the patient to live an active lifestyle and complete activities that increases heart rate to 85% target heart rate at least 5 times per week for one hour.            Respiratory   Pharyngitis    We performed Covid test on the patient.  It was negative.  Patient throat was congested and reddish looking.  Started on Z-Pak as directed patient was also advised to take saline gargles 3-4 times a day.      Relevant Medications   azithromycin (ZITHROMAX) 250 MG tablet     Nervous and Auditory   Neuropathy    Patient complains of myalgia muscle ache.  His neurological examination was normal.  Neck was supple.  His MRI and CT scan in the past were reviewed.  No abnormality was detected.  His overall reflexes are slightly decreased vibration sense was slightly decreased in the lower legs as compared with the forehead.  Sensation was normal, touch and pain proprioception was normal.  Patient was started on Lyrica 50 mg p.o. 3 times a day.  He was also told to have blood drawn for B12 level.  He was advised not to drink city water.  We will also check Fana, serum protein electrophoresis.  He was advised to get  a neuro evaluation to rule out neuropathy.      Relevant Medications   pregabalin (LYRICA) 50 MG capsule     Other   Erectile dysfunction    Advised to use Viagra or Viagra like products.  Testosterone level was okay.      Suspected COVID-19 virus infection - Primary    Covid test was negative in the office      Relevant Orders   POC COVID-19 (Completed)   ANA   B12 and Folate Panel   Protein Electrophoresis, (serum)   CK (Creatine Kinase)   Metabolic syndrome    Patient was advised to lose weight.         Meds ordered this encounter  Medications  . pregabalin (LYRICA) 50 MG capsule    Sig: Take 1 capsule (50 mg total) by mouth 3 (three) times daily.    Dispense:  90 capsule    Refill:  1  . azithromycin (ZITHROMAX) 250 MG tablet    Sig: 2 tab po daily  For 3 days    Dispense:  6 tablet    Refill:  0         Corky Downs, MD [facility] [address] [place], [place] [address]   By signing my name below, I, [employer], attest that this documentation has been prepared under  the direction and in the presence of Dr. Corky Downs Electronically Signed: Corky Downs, MD 10/03/20, 6:27 PM   I personally performed the services described in this documentation, which was SCRIBED in my presence. The recorded information has been reviewed and considered accurate. It has been edited as necessary during review. Corky Downs, MD

## 2020-10-05 LAB — PROTEIN ELECTROPHORESIS, SERUM
Albumin ELP: 4.2 g/dL (ref 3.8–4.8)
Alpha 1: 0.3 g/dL (ref 0.2–0.3)
Alpha 2: 1.1 g/dL — ABNORMAL HIGH (ref 0.5–0.9)
Beta 2: 0.4 g/dL (ref 0.2–0.5)
Beta Globulin: 0.6 g/dL (ref 0.4–0.6)
Gamma Globulin: 1.1 g/dL (ref 0.8–1.7)
Total Protein: 7.8 g/dL (ref 6.1–8.1)

## 2020-10-05 LAB — B12 AND FOLATE PANEL
Folate: 15.7 ng/mL
Vitamin B-12: 1366 pg/mL — ABNORMAL HIGH (ref 200–1100)

## 2020-10-05 LAB — EXTRA LAV TOP TUBE

## 2020-10-05 LAB — CK: Total CK: 178 U/L (ref 44–196)

## 2020-10-09 LAB — ANA: Anti Nuclear Antibody (ANA): POSITIVE — AB

## 2020-10-09 LAB — ANTI-NUCLEAR AB-TITER (ANA TITER): ANA Titer 1: 1:40 {titer} — ABNORMAL HIGH

## 2020-10-30 ENCOUNTER — Other Ambulatory Visit: Payer: Self-pay

## 2020-10-30 ENCOUNTER — Ambulatory Visit (INDEPENDENT_AMBULATORY_CARE_PROVIDER_SITE_OTHER): Payer: Medicare HMO | Admitting: Internal Medicine

## 2020-10-30 ENCOUNTER — Encounter: Payer: Self-pay | Admitting: Internal Medicine

## 2020-10-30 VITALS — BP 138/84 | HR 96 | Ht 69.0 in | Wt 208.4 lb

## 2020-10-30 DIAGNOSIS — K2930 Chronic superficial gastritis without bleeding: Secondary | ICD-10-CM | POA: Diagnosis not present

## 2020-10-30 DIAGNOSIS — E8881 Metabolic syndrome: Secondary | ICD-10-CM

## 2020-10-30 DIAGNOSIS — G629 Polyneuropathy, unspecified: Secondary | ICD-10-CM

## 2020-10-30 DIAGNOSIS — N529 Male erectile dysfunction, unspecified: Secondary | ICD-10-CM | POA: Diagnosis not present

## 2020-10-30 DIAGNOSIS — I10 Essential (primary) hypertension: Secondary | ICD-10-CM

## 2020-10-30 MED ORDER — OMEPRAZOLE 20 MG PO CPDR: 20.0000 mg | DELAYED_RELEASE_CAPSULE | Freq: Every day | ORAL | 3 refills | Status: DC

## 2020-10-30 MED ORDER — TADALAFIL 20 MG PO TABS: 10.0000 mg | ORAL_TABLET | ORAL | 11 refills | Status: DC | PRN

## 2020-10-30 NOTE — Progress Notes (Signed)
Established Patient Office Visit  Subjective:  Patient ID: Zachary Taylor, male    DOB: 05/22/1951  Age: 69 y.o. MRN: [id number]  CC:  Chief Complaint  Patient presents with  . Gastroesophageal Reflux     Timm Bonenberger presents for abdominal pain in the subxiphoid area there is no history of black stool.  Denies any chest pain going to the arms shoulder or back.  There is no shortness of breath complains of pain in the both legs and cramps  Past Medical History:  Diagnosis Date  . Hypertension     History reviewed. No pertinent surgical history.  History reviewed. No pertinent family history.  Social History   Socioeconomic History  . Marital status: Married    Spouse name: Not on file  . Number of children: Not on file  . Years of education: Not on file  . Highest education level: Not on file  Occupational History  . Not on file  Tobacco Use  . Smoking status: Never Smoker  . Smokeless tobacco: Never Used  Substance and Sexual Activity  . Alcohol use: No  . Drug use: No  . Sexual activity: Not on file  Other Topics Concern  . Not on file  Social History Narrative  . Not on file   Social Determinants of Health   Financial Resource Strain:   . Difficulty of Paying Living Expenses: Not on file  Food Insecurity:   . Worried About Programme researcher, broadcasting/film/video in the Last Year: Not on file  . Ran Out of Food in the Last Year: Not on file  Transportation Needs:   . Lack of Transportation (Medical): Not on file  . Lack of Transportation (Non-Medical): Not on file  Physical Activity:   . Days of Exercise per Week: Not on file  . Minutes of Exercise per Session: Not on file  Stress:   . Feeling of Stress : Not on file  Social Connections:   . Frequency of Communication with Friends and Family: Not on file  . Frequency of Social Gatherings with Friends and Family: Not on file  . Attends Religious Services: Not on file  . Active Member of Clubs or Organizations: Not on file  .  Attends Banker Meetings: Not on file  . Marital Status: Not on file  Intimate Partner Violence:   . Fear of Current or Ex-Partner: Not on file  . Emotionally Abused: Not on file  . Physically Abused: Not on file  . Sexually Abused: Not on file     Current Outpatient Medications:  .  azithromycin (ZITHROMAX) 250 MG tablet, 2 tab po daily  For 3 days, Disp: 6 tablet, Rfl: 0 .  cyclobenzaprine (FLEXERIL) 10 MG tablet, Take 1 tablet (10 mg total) by mouth 2 (two) times daily as needed for muscle spasms., Disp: 60 tablet, Rfl: 0 .  losartan-hydrochlorothiazide (HYZAAR) 100-25 MG tablet, Take 1 tablet by mouth once daily, Disp: 90 tablet, Rfl: 0 .  pregabalin (LYRICA) 50 MG capsule, Take 1 capsule (50 mg total) by mouth 3 (three) times daily., Disp: 90 capsule, Rfl: 1 .  omeprazole (PRILOSEC) 20 MG capsule, Take 1 capsule (20 mg total) by mouth daily., Disp: 30 capsule, Rfl: 3 .  tadalafil (CIALIS) 20 MG tablet, Take 0.5-1 tablets (10-20 mg total) by mouth every other day as needed for erectile dysfunction., Disp: 5 tablet, Rfl: 11   No Known Allergies  ROS Review of Systems  Constitutional: Negative.   HENT: Negative.  Eyes: Negative.   Respiratory: Negative.   Cardiovascular: Negative.   Gastrointestinal: Negative.   Endocrine: Negative.   Genitourinary: Negative.   Musculoskeletal: Negative.   Skin: Negative.   Allergic/Immunologic: Negative.   Neurological: Negative.   Hematological: Negative.   Psychiatric/Behavioral: Negative.   All other systems reviewed and are negative.     Objective:    Physical Exam Vitals reviewed.  Constitutional:      Appearance: Normal appearance.  HENT:     Mouth/Throat:     Mouth: Mucous membranes are moist.  Eyes:     Pupils: Pupils are equal, round, and reactive to light.  Neck:     Vascular: No carotid bruit.  Cardiovascular:     Rate and Rhythm: Normal rate and regular rhythm.     Pulses: Normal pulses.     Heart  sounds: Normal heart sounds.  Pulmonary:     Effort: Pulmonary effort is normal.     Breath sounds: Normal breath sounds.  Abdominal:     General: Bowel sounds are normal.     Palpations: Abdomen is soft. There is no hepatomegaly, splenomegaly or mass.     Tenderness: There is no abdominal tenderness.     Hernia: No hernia is present.  Musculoskeletal:     Cervical back: Neck supple.     Right lower leg: No edema.     Left lower leg: No edema.  Skin:    Findings: No rash.  Neurological:     Mental Status: He is alert and oriented to person, place, and time.     Motor: No weakness.  Psychiatric:        Mood and Affect: Mood normal.        Behavior: Behavior normal.     BP 138/84   Pulse 96   Ht 5\' 9"  (1.753 m)   Wt 208 lb 6.4 oz (94.5 kg)   BMI 30.78 kg/m  Wt Readings from Last 3 Encounters:  10/30/20 208 lb 6.4 oz (94.5 kg)  10/03/20 211 lb 8 oz (95.9 kg)  05/19/20 203 lb 1.6 oz (92.1 kg)     Health Maintenance Due  Topic Date Due  . Hepatitis C Screening  Never done  . COVID-19 Vaccine (1) Never done  . TETANUS/TDAP  Never done  . COLONOSCOPY  Never done  . PNA vac Low Risk Adult (1 of 2 - PCV13) Never done  . INFLUENZA VACCINE  Never done    There are no preventive care reminders to display for this patient.  Lab Results  Component Value Date   TSH 2.11 05/23/2020   Lab Results  Component Value Date   WBC 4.8 05/23/2020   HGB 12.0 (L) 05/23/2020   HCT 38.4 (L) 05/23/2020   MCV 83.3 05/23/2020   PLT 270 05/23/2020   Lab Results  Component Value Date   NA 139 05/23/2020   K 4.6 05/23/2020   CO2 24 05/23/2020   GLUCOSE 108 (H) 05/23/2020   BUN 18 05/23/2020   CREATININE 1.04 05/23/2020   BILITOT 0.7 05/23/2020   AST 21 05/23/2020   ALT 21 05/23/2020   PROT 7.8 10/03/2020   CALCIUM 9.5 05/23/2020   ANIONGAP 10 03/19/2017   No results found for: CHOL No results found for: HDL No results found for: LDLCALC No results found for: TRIG No  results found for: CHOLHDL No results found for: 03/21/2017    Assessment & Plan:   Problem List Items Addressed This Visit  Cardiovascular and Mediastinum   Essential hypertension    Hypertension is stable on the present medical treatment.      Relevant Medications   tadalafil (CIALIS) 20 MG tablet     Digestive   Superficial gastritis without hemorrhage - Primary    Patient seen to have reflux she was started on Prilosec 20 mg p.o. daily advised to use antacids like Mylanta 1 tablespoonful twice a day as needed.  Avoid coffee tea and chocolate.      Relevant Medications   omeprazole (PRILOSEC) 20 MG capsule     Nervous and Auditory   Neuropathy    Patient has neuropathy of the leg and advised to take Lyrica.        Other   Erectile dysfunction    We will start the patient on Cialis.      Metabolic syndrome    - I encouraged the patient to lose weight.  - I educated them on making healthy dietary choices including eating more fruits and vegetables and less fried foods. - I encouraged the patient to exercise more, and educated on the benefits of exercise including weight loss, diabetes prevention, and hypertension prevention.           Meds ordered this encounter  Medications  . omeprazole (PRILOSEC) 20 MG capsule    Sig: Take 1 capsule (20 mg total) by mouth daily.    Dispense:  30 capsule    Refill:  3  . tadalafil (CIALIS) 20 MG tablet    Sig: Take 0.5-1 tablets (10-20 mg total) by mouth every other day as needed for erectile dysfunction.    Dispense:  5 tablet    Refill:  11    Follow-up: No follow-ups on file.    Corky Downs, MD

## 2020-11-05 ENCOUNTER — Encounter: Payer: Self-pay | Admitting: Internal Medicine

## 2020-11-05 NOTE — Assessment & Plan Note (Signed)
Patient seen to have reflux she was started on Prilosec 20 mg p.o. daily advised to use antacids like Mylanta 1 tablespoonful twice a day as needed.  Avoid coffee tea and chocolate.

## 2020-11-05 NOTE — Assessment & Plan Note (Signed)
Hypertension is stable on the present medical treatment.

## 2020-11-05 NOTE — Assessment & Plan Note (Signed)
-   I encouraged the patient to lose weight.  - I educated them on making healthy dietary choices including eating more fruits and vegetables and less fried foods. - I encouraged the patient to exercise more, and educated on the benefits of exercise including weight loss, diabetes prevention, and hypertension prevention.   

## 2020-11-05 NOTE — Assessment & Plan Note (Signed)
We will start the patient on Cialis.

## 2020-11-05 NOTE — Assessment & Plan Note (Signed)
Patient has neuropathy of the leg and advised to take Lyrica.

## 2020-11-07 ENCOUNTER — Other Ambulatory Visit: Payer: Self-pay | Admitting: *Deleted

## 2020-11-07 DIAGNOSIS — N529 Male erectile dysfunction, unspecified: Secondary | ICD-10-CM

## 2020-11-07 MED ORDER — TADALAFIL 20 MG PO TABS: 10.0000 mg | ORAL_TABLET | ORAL | 11 refills | Status: DC | PRN

## 2020-12-25 ENCOUNTER — Ambulatory Visit (INDEPENDENT_AMBULATORY_CARE_PROVIDER_SITE_OTHER): Payer: Medicare HMO | Admitting: Internal Medicine

## 2020-12-25 ENCOUNTER — Encounter: Payer: Self-pay | Admitting: Internal Medicine

## 2020-12-25 ENCOUNTER — Other Ambulatory Visit: Payer: Self-pay

## 2020-12-25 VITALS — BP 157/102 | HR 59 | Ht 70.0 in | Wt 206.6 lb

## 2020-12-25 DIAGNOSIS — G629 Polyneuropathy, unspecified: Secondary | ICD-10-CM

## 2020-12-25 DIAGNOSIS — K2930 Chronic superficial gastritis without bleeding: Secondary | ICD-10-CM | POA: Diagnosis not present

## 2020-12-25 DIAGNOSIS — K2100 Gastro-esophageal reflux disease with esophagitis, without bleeding: Secondary | ICD-10-CM | POA: Diagnosis not present

## 2020-12-25 DIAGNOSIS — E8881 Metabolic syndrome: Secondary | ICD-10-CM

## 2020-12-25 DIAGNOSIS — J301 Allergic rhinitis due to pollen: Secondary | ICD-10-CM | POA: Insufficient documentation

## 2020-12-25 MED ORDER — RABEPRAZOLE SODIUM 20 MG PO TBEC: 20.0000 mg | DELAYED_RELEASE_TABLET | Freq: Every day | ORAL | 4 refills | Status: DC

## 2020-12-25 NOTE — Progress Notes (Signed)
Established Patient Office Visit  Subjective:  Patient ID: Zachary Taylor, male    DOB: 1951/07/07  Age: 70 y.o. MRN: [id number]  CC:  Chief Complaint  Patient presents with  . Hypertension    Patient is here for a routine bp check       Zachary Taylor presents for check up, he complains of dyspepsia heartburn and bloating blood pressure stable.  Denies any chest pain also complains of edema neuropathy which is stable  Past Medical History:  Diagnosis Date  . Hypertension     History reviewed. No pertinent surgical history.  History reviewed. No pertinent family history.  Social History   Socioeconomic History  . Marital status: Married    Spouse name: Not on file  . Number of children: Not on file  . Years of education: Not on file  . Highest education level: Not on file  Occupational History  . Not on file  Tobacco Use  . Smoking status: Never Smoker  . Smokeless tobacco: Never Used  Substance and Sexual Activity  . Alcohol use: No  . Drug use: No  . Sexual activity: Not on file  Other Topics Concern  . Not on file  Social History Narrative  . Not on file   Social Determinants of Health   Financial Resource Strain: Not on file  Food Insecurity: Not on file  Transportation Needs: Not on file  Physical Activity: Not on file  Stress: Not on file  Social Connections: Not on file  Intimate Partner Violence: Not on file     Current Outpatient Medications:  .  RABEprazole (ACIPHEX) 20 MG tablet, Take 1 tablet (20 mg total) by mouth daily., Disp: 30 tablet, Rfl: 4 .  azithromycin (ZITHROMAX) 250 MG tablet, 2 tab po daily  For 3 days, Disp: 6 tablet, Rfl: 0 .  cyclobenzaprine (FLEXERIL) 10 MG tablet, Take 1 tablet (10 mg total) by mouth 2 (two) times daily as needed for muscle spasms., Disp: 60 tablet, Rfl: 0 .  losartan-hydrochlorothiazide (HYZAAR) 100-25 MG tablet, Take 1 tablet by mouth once daily, Disp: 90 tablet, Rfl: 0 .  omeprazole (PRILOSEC) 20 MG capsule,  Take 1 capsule (20 mg total) by mouth daily., Disp: 30 capsule, Rfl: 3 .  pregabalin (LYRICA) 50 MG capsule, Take 1 capsule (50 mg total) by mouth 3 (three) times daily., Disp: 90 capsule, Rfl: 1 .  tadalafil (CIALIS) 20 MG tablet, Take 0.5-1 tablets (10-20 mg total) by mouth every other day as needed for erectile dysfunction., Disp: 5 tablet, Rfl: 11   No Known Allergies  ROS Review of Systems  Constitutional: Negative.   HENT: Negative.   Eyes: Negative.   Respiratory: Negative.   Cardiovascular: Negative.   Gastrointestinal: Negative.   Endocrine: Negative.   Genitourinary: Negative.   Musculoskeletal: Negative.   Skin: Negative.   Allergic/Immunologic: Negative.   Neurological: Negative.   Hematological: Negative.   Psychiatric/Behavioral: Negative.   All other systems reviewed and are negative.     Objective:    Physical Exam Vitals reviewed.  Constitutional:      Appearance: Normal appearance.  HENT:     Mouth/Throat:     Mouth: Mucous membranes are moist.  Eyes:     Pupils: Pupils are equal, round, and reactive to light.  Neck:     Vascular: No carotid bruit.  Cardiovascular:     Rate and Rhythm: Normal rate and regular rhythm.     Pulses: Normal pulses.     Heart  sounds: Normal heart sounds.  Pulmonary:     Effort: Pulmonary effort is normal.     Breath sounds: Normal breath sounds.  Abdominal:     General: Bowel sounds are normal.     Palpations: Abdomen is soft. There is no hepatomegaly, splenomegaly or mass.     Tenderness: There is no abdominal tenderness.     Hernia: No hernia is present.  Musculoskeletal:     Cervical back: Neck supple.     Right lower leg: No edema.     Left lower leg: No edema.  Skin:    Findings: No rash.  Neurological:     Mental Status: He is alert and oriented to person, place, and time.     Motor: No weakness.  Psychiatric:        Mood and Affect: Mood normal.        Behavior: Behavior normal.     BP (!) 157/102    Pulse (!) 59   Ht 5\' 10"  (1.778 m)   Wt 206 lb 9.6 oz (93.7 kg)   BMI 29.64 kg/m  Wt Readings from Last 3 Encounters:  12/25/20 206 lb 9.6 oz (93.7 kg)  10/30/20 208 lb 6.4 oz (94.5 kg)  10/03/20 211 lb 8 oz (95.9 kg)     Health Maintenance Due  Topic Date Due  . Hepatitis C Screening  Never done  . COVID-19 Vaccine (1) Never done  . TETANUS/TDAP  Never done  . COLONOSCOPY (Pts 45-84yrs Insurance coverage will need to be confirmed)  Never done  . PNA vac Low Risk Adult (1 of 2 - PCV13) Never done  . INFLUENZA VACCINE  Never done    There are no preventive care reminders to display for this patient.  Lab Results  Component Value Date   TSH 2.11 05/23/2020   Lab Results  Component Value Date   WBC 4.8 05/23/2020   HGB 12.0 (L) 05/23/2020   HCT 38.4 (L) 05/23/2020   MCV 83.3 05/23/2020   PLT 270 05/23/2020   Lab Results  Component Value Date   NA 139 05/23/2020   K 4.6 05/23/2020   CO2 24 05/23/2020   GLUCOSE 108 (H) 05/23/2020   BUN 18 05/23/2020   CREATININE 1.04 05/23/2020   BILITOT 0.7 05/23/2020   AST 21 05/23/2020   ALT 21 05/23/2020   PROT 7.8 10/03/2020   CALCIUM 9.5 05/23/2020   ANIONGAP 10 03/19/2017   No results found for: CHOL No results found for: HDL No results found for: LDLCALC No results found for: TRIG No results found for: CHOLHDL No results found for: 03/21/2017    Assessment & Plan:   Problem List Items Addressed This Visit      Respiratory   Seasonal allergic rhinitis due to pollen    Patient was advised to take Claritin 5 mg p.o. daily        Digestive   Superficial gastritis without hemorrhage    Advised to take Mylanta as needed along with AcipHex.  Avoid spicy [id number] food      Gastroesophageal reflux disease with esophagitis without hemorrhage - Primary    Patient does not like Prilosec so I started him on AcipHex he was advised sobi diet      Relevant Medications   RABEprazole (ACIPHEX) 20 MG tablet     Nervous  and Auditory   Neuropathy    Neuropathy stable at the present time.        Other   Metabolic syndrome    -  I encouraged the patient to lose weight.  - I educated them on making healthy dietary choices including eating more fruits and vegetables and less fried foods. - I encouraged the patient to exercise more, and educated on the benefits of exercise including weight loss, diabetes prevention, and hypertension prevention.           Meds ordered this encounter  Medications  . RABEprazole (ACIPHEX) 20 MG tablet    Sig: Take 1 tablet (20 mg total) by mouth daily.    Dispense:  30 tablet    Refill:  4    Follow-up: No follow-ups on file.    Corky Downs, MD

## 2020-12-25 NOTE — Assessment & Plan Note (Signed)
Patient does not like Prilosec so I started him on AcipHex he was advised sobi diet

## 2020-12-25 NOTE — Assessment & Plan Note (Signed)
Patient was advised to take Claritin 5 mg p.o. daily ?

## 2020-12-25 NOTE — Assessment & Plan Note (Signed)
-   I encouraged the patient to lose weight.  - I educated them on making healthy dietary choices including eating more fruits and vegetables and less fried foods. - I encouraged the patient to exercise more, and educated on the benefits of exercise including weight loss, diabetes prevention, and hypertension prevention.   

## 2020-12-25 NOTE — Assessment & Plan Note (Signed)
Neuropathy stable at the present time.

## 2020-12-25 NOTE — Assessment & Plan Note (Signed)
Advised to take Mylanta as needed along with AcipHex.  Avoid spicy [place] food

## 2021-03-26 ENCOUNTER — Ambulatory Visit: Payer: Medicare HMO | Admitting: Internal Medicine

## 2021-04-24 ENCOUNTER — Other Ambulatory Visit: Payer: Self-pay

## 2021-04-24 MED ORDER — GABAPENTIN 100 MG PO CAPS: 100.0000 mg | ORAL_CAPSULE | Freq: Three times a day (TID) | ORAL | 3 refills | Status: DC

## 2021-06-18 ENCOUNTER — Encounter: Payer: Medicare HMO | Admitting: Internal Medicine

## 2021-07-03 ENCOUNTER — Ambulatory Visit (INDEPENDENT_AMBULATORY_CARE_PROVIDER_SITE_OTHER): Payer: Medicare HMO | Admitting: Internal Medicine

## 2021-07-03 ENCOUNTER — Other Ambulatory Visit: Payer: Self-pay

## 2021-07-03 ENCOUNTER — Encounter: Payer: Self-pay | Admitting: Internal Medicine

## 2021-07-03 VITALS — BP 140/80 | HR 87 | Ht 70.0 in | Wt 207.3 lb

## 2021-07-03 DIAGNOSIS — E8881 Metabolic syndrome: Secondary | ICD-10-CM | POA: Diagnosis not present

## 2021-07-03 DIAGNOSIS — I10 Essential (primary) hypertension: Secondary | ICD-10-CM

## 2021-07-03 DIAGNOSIS — Z0000 Encounter for general adult medical examination without abnormal findings: Secondary | ICD-10-CM | POA: Diagnosis not present

## 2021-07-03 DIAGNOSIS — J301 Allergic rhinitis due to pollen: Secondary | ICD-10-CM

## 2021-07-03 DIAGNOSIS — G629 Polyneuropathy, unspecified: Secondary | ICD-10-CM

## 2021-07-03 MED ORDER — PREGABALIN 50 MG PO CAPS: 50.0000 mg | ORAL_CAPSULE | Freq: Three times a day (TID) | ORAL | 2 refills | Status: DC

## 2021-07-03 NOTE — Assessment & Plan Note (Signed)
Take Claritin 5 mg p.o. daily as needed 

## 2021-07-03 NOTE — Assessment & Plan Note (Signed)

## 2021-07-03 NOTE — Assessment & Plan Note (Signed)
Patient was started on Lyrica 50 mg p.o. 3 times a day.

## 2021-07-03 NOTE — Progress Notes (Addendum)
Established Patient Office Visit  Subjective:  Patient ID: Zachary Taylor, male    DOB: 07-30-1951  Age: 70 y.o. MRN: [id number]  CC:  Chief Complaint  Patient presents with   Annual Exam    HPI  Zachary Taylor presents for pain in both legs, Past Medical History:  Diagnosis Date   Hypertension     History reviewed. No pertinent surgical history.  History reviewed. No pertinent family history.  Social History   Socioeconomic History   Marital status: Married    Spouse name: Not on file   Number of children: Not on file   Years of education: Not on file   Highest education level: Not on file  Occupational History   Not on file  Tobacco Use   Smoking status: Never   Smokeless tobacco: Never  Substance and Sexual Activity   Alcohol use: No   Drug use: No   Sexual activity: Not on file  Other Topics Concern   Not on file  Social History Narrative   Not on file   Social Determinants of Health   Financial Resource Strain: Not on file  Food Insecurity: Not on file  Transportation Needs: Not on file  Physical Activity: Not on file  Stress: Not on file  Social Connections: Not on file  Intimate Partner Violence: Not on file     Current Outpatient Medications:    pregabalin (LYRICA) 50 MG capsule, Take 1 capsule (50 mg total) by mouth 3 (three) times daily., Disp: 90 capsule, Rfl: 2   cyclobenzaprine (FLEXERIL) 10 MG tablet, Take 1 tablet (10 mg total) by mouth 2 (two) times daily as needed for muscle spasms., Disp: 60 tablet, Rfl: 0   gabapentin (NEURONTIN) 100 MG capsule, Take 1 capsule (100 mg total) by mouth 3 (three) times daily., Disp: 90 capsule, Rfl: 3   losartan-hydrochlorothiazide (HYZAAR) 100-25 MG tablet, Take 1 tablet by mouth once daily, Disp: 90 tablet, Rfl: 0   omeprazole (PRILOSEC) 20 MG capsule, Take 1 capsule (20 mg total) by mouth daily., Disp: 30 capsule, Rfl: 3   RABEprazole (ACIPHEX) 20 MG tablet, Take 1 tablet (20 mg total) by mouth daily., Disp:  30 tablet, Rfl: 4   tadalafil (CIALIS) 20 MG tablet, Take 0.5-1 tablets (10-20 mg total) by mouth every other day as needed for erectile dysfunction., Disp: 5 tablet, Rfl: 11   No Known Allergies  ROS Review of Systems  Constitutional: Negative.   HENT: Negative.    Eyes: Negative.   Respiratory: Negative.    Cardiovascular: Negative.   Gastrointestinal: Negative.   Endocrine: Negative.   Genitourinary: Negative.   Musculoskeletal: Negative.   Skin: Negative.   Allergic/Immunologic: Negative.   Neurological: Negative.   Hematological: Negative.   Psychiatric/Behavioral: Negative.    All other systems reviewed and are negative.    Objective:    Physical Exam Vitals reviewed.  Constitutional:      Appearance: Normal appearance.  HENT:     Mouth/Throat:     Mouth: Mucous membranes are moist.  Eyes:     Pupils: Pupils are equal, round, and reactive to light.  Neck:     Vascular: No carotid bruit.  Cardiovascular:     Rate and Rhythm: Normal rate and regular rhythm.     Pulses: Normal pulses.     Heart sounds: Normal heart sounds.  Pulmonary:     Effort: Pulmonary effort is normal.     Breath sounds: Normal breath sounds.  Abdominal:  General: Bowel sounds are normal.     Palpations: Abdomen is soft. There is no hepatomegaly, splenomegaly or mass.     Tenderness: There is no abdominal tenderness.     Hernia: No hernia is present.  Musculoskeletal:        General: Normal range of motion.     Cervical back: Neck supple.     Right lower leg: No edema.     Left lower leg: No edema.  Skin:    Findings: No rash.  Neurological:     General: No focal deficit present.     Mental Status: He is alert and oriented to person, place, and time.     Motor: No weakness.  Psychiatric:        Mood and Affect: Mood normal.        Behavior: Behavior normal.        Thought Content: Thought content normal.        Judgment: Judgment normal.    BP 140/80   Pulse 87   Ht 5'  10" (1.778 m)   Wt 207 lb 4.8 oz (94 kg)   BMI 29.74 kg/m  Wt Readings from Last 3 Encounters:  07/03/21 207 lb 4.8 oz (94 kg)  12/25/20 206 lb 9.6 oz (93.7 kg)  10/30/20 208 lb 6.4 oz (94.5 kg)     Health Maintenance Due  Topic Date Due   Hepatitis C Screening  Never done   TETANUS/TDAP  Never done   COLONOSCOPY (Pts 45-89yrs Insurance coverage will need to be confirmed)  Never done   Zoster Vaccines- Shingrix (1 of 2) Never done   PNA vac Low Risk Adult (1 of 2 - PCV13) Never done   COVID-19 Vaccine (4 - Booster for Pfizer series) 03/24/2021    There are no preventive care reminders to display for this patient.  Lab Results  Component Value Date   TSH 2.11 05/23/2020   Lab Results  Component Value Date   WBC 4.8 05/23/2020   HGB 12.0 (L) 05/23/2020   HCT 38.4 (L) 05/23/2020   MCV 83.3 05/23/2020   PLT 270 05/23/2020   Lab Results  Component Value Date   NA 139 05/23/2020   K 4.6 05/23/2020   CO2 24 05/23/2020   GLUCOSE 108 (H) 05/23/2020   BUN 18 05/23/2020   CREATININE 1.04 05/23/2020   BILITOT 0.7 05/23/2020   AST 21 05/23/2020   ALT 21 05/23/2020   PROT 7.8 10/03/2020   CALCIUM 9.5 05/23/2020   ANIONGAP 10 03/19/2017   No results found for: CHOL No results found for: HDL No results found for: LDLCALC No results found for: TRIG No results found for: CHOLHDL No results found for: XLKG4W    Assessment & Plan:   Problem List Items Addressed This Visit       Cardiovascular and Mediastinum   Essential hypertension     Patient denies any chest pain or shortness of breath there is no history of palpitation or paroxysmal nocturnal dyspnea   patient was advised to follow low-salt low-cholesterol diet    ideally I want to keep systolic blood pressure below 102 mmHg, patient was asked to check blood pressure one times a week and give me a report on that.  Patient will be follow-up in 3 months  or earlier as needed, patient will call me back for any change  in the cardiovascular symptoms Patient was advised to buy a book from local bookstore concerning blood pressure and read several  chapters  every day.  This will be supplemented by some of the material we will give him from the office.  Patient should also utilize other resources like YouTube and Internet to learn more about the blood pressure and the diet.         Respiratory   Seasonal allergic rhinitis due to pollen    Take Claritin 5 mg p.o. daily as needed         Nervous and Auditory   Neuropathy - Primary    Patient was started on Lyrica 50 mg p.o. 3 times a day.       Relevant Medications   pregabalin (LYRICA) 50 MG capsule     Other   Metabolic syndrome    - I encouraged the patient to lose weight.  - I educated them on making healthy dietary choices including eating more fruits and vegetables and less fried foods. - I encouraged the patient to exercise more, and educated on the benefits of exercise including weight loss, diabetes prevention, and hypertension prevention.   Dietary counseling with a registered dietician  Referral to a weight management support group (e.g. Weight Watchers, Overeaters Anonymous)  If your BMI is greater than 29 or you have gained more than 15 pounds you should work on weight loss.  Attend a healthy cooking class         Meds ordered this encounter  Medications   pregabalin (LYRICA) 50 MG capsule    Sig: Take 1 capsule (50 mg total) by mouth 3 (three) times daily.    Dispense:  90 capsule    Refill:  2    Follow-up: No follow-ups on file.    Corky Downs, MD

## 2021-07-03 NOTE — Assessment & Plan Note (Signed)

## 2021-07-09 ENCOUNTER — Ambulatory Visit (INDEPENDENT_AMBULATORY_CARE_PROVIDER_SITE_OTHER): Payer: Medicare HMO | Admitting: Internal Medicine

## 2021-07-09 ENCOUNTER — Other Ambulatory Visit: Payer: Self-pay

## 2021-07-09 DIAGNOSIS — I10 Essential (primary) hypertension: Secondary | ICD-10-CM | POA: Diagnosis not present

## 2021-07-09 DIAGNOSIS — Z1211 Encounter for screening for malignant neoplasm of colon: Secondary | ICD-10-CM | POA: Diagnosis not present

## 2021-07-09 DIAGNOSIS — N529 Male erectile dysfunction, unspecified: Secondary | ICD-10-CM | POA: Diagnosis not present

## 2021-07-09 DIAGNOSIS — E8881 Metabolic syndrome: Secondary | ICD-10-CM | POA: Diagnosis not present

## 2021-07-09 DIAGNOSIS — G629 Polyneuropathy, unspecified: Secondary | ICD-10-CM | POA: Diagnosis not present

## 2021-07-09 DIAGNOSIS — K2100 Gastro-esophageal reflux disease with esophagitis, without bleeding: Secondary | ICD-10-CM | POA: Diagnosis not present

## 2021-07-09 DIAGNOSIS — R0602 Shortness of breath: Secondary | ICD-10-CM

## 2021-07-10 LAB — PSA: PSA: 2.07 ng/mL (ref <=4.00)

## 2021-07-10 LAB — COMPLETE METABOLIC PANEL WITH GFR
AG Ratio: 1.4 (calc) (ref 1.0–2.5)
ALT: 19 U/L (ref 9–46)
AST: 21 U/L (ref 10–35)
Albumin: 4.2 g/dL (ref 3.6–5.1)
Alkaline phosphatase (APISO): 58 U/L (ref 35–144)
BUN: 16 mg/dL (ref 7–25)
CO2: 23 mmol/L (ref 20–32)
Calcium: 9.1 mg/dL (ref 8.6–10.3)
Chloride: 105 mmol/L (ref 98–110)
Creat: 1.12 mg/dL (ref 0.70–1.35)
Globulin: 3 g/dL (calc) (ref 1.9–3.7)
Glucose, Bld: 99 mg/dL (ref 65–99)
Potassium: 4.2 mmol/L (ref 3.5–5.3)
Sodium: 138 mmol/L (ref 135–146)
Total Bilirubin: 0.8 mg/dL (ref 0.2–1.2)
Total Protein: 7.2 g/dL (ref 6.1–8.1)
eGFR: 72 mL/min/{1.73_m2} (ref >=60)

## 2021-07-10 LAB — TESTOSTERONE: Testosterone: 447 ng/dL (ref 250–827)

## 2021-07-10 LAB — CBC WITH DIFFERENTIAL/PLATELET
Absolute Monocytes: 588 cells/uL (ref 200–950)
Basophils Absolute: 42 cells/uL (ref 0–200)
Basophils Relative: 0.8 %
Eosinophils Absolute: 339 cells/uL (ref 15–500)
Eosinophils Relative: 6.4 %
HCT: 29.1 % — ABNORMAL LOW (ref 38.5–50.0)
Hemoglobin: 7.8 g/dL — ABNORMAL LOW (ref 13.2–17.1)
Lymphs Abs: 933 cells/uL (ref 850–3900)
MCH: 17.6 pg — ABNORMAL LOW (ref 27.0–33.0)
MCHC: 26.8 g/dL — ABNORMAL LOW (ref 32.0–36.0)
MCV: 65.8 fL — ABNORMAL LOW (ref 80.0–100.0)
MPV: 9.8 fL (ref 7.5–12.5)
Monocytes Relative: 11.1 %
Neutro Abs: 3397 cells/uL (ref 1500–7800)
Neutrophils Relative %: 64.1 %
Platelets: 305 10*3/uL (ref 140–400)
RBC: 4.42 10*6/uL (ref 4.20–5.80)
RDW: 19.5 % — ABNORMAL HIGH (ref 11.0–15.0)
Total Lymphocyte: 17.6 %
WBC: 5.3 10*3/uL (ref 3.8–10.8)

## 2021-07-10 LAB — CBC MORPHOLOGY

## 2021-07-10 LAB — LIPID PANEL
Cholesterol: 150 mg/dL (ref <200)
HDL: 36 mg/dL — ABNORMAL LOW (ref >=40)
LDL Cholesterol (Calc): 92 mg/dL (calc)
Non-HDL Cholesterol (Calc): 114 mg/dL (calc) (ref <130)
Total CHOL/HDL Ratio: 4.2 (calc) (ref <5.0)
Triglycerides: 120 mg/dL (ref <150)

## 2021-07-10 LAB — TSH: TSH: 2 mIU/L (ref 0.40–4.50)

## 2021-07-10 NOTE — Progress Notes (Signed)
t

## 2021-07-11 ENCOUNTER — Other Ambulatory Visit: Payer: Self-pay | Admitting: *Deleted

## 2021-07-11 DIAGNOSIS — D649 Anemia, unspecified: Secondary | ICD-10-CM

## 2021-07-16 ENCOUNTER — Other Ambulatory Visit: Payer: Self-pay

## 2021-07-16 ENCOUNTER — Ambulatory Visit
Admission: RE | Admit: 2021-07-16 | Discharge: 2021-07-16 | Disposition: A | Discharge status: Home / Self Care | Payer: Medicare HMO | Source: Ambulatory Visit | Attending: Internal Medicine | Admitting: Internal Medicine

## 2021-07-16 ENCOUNTER — Encounter: Payer: Self-pay | Admitting: Internal Medicine

## 2021-07-16 ENCOUNTER — Ambulatory Visit (INDEPENDENT_AMBULATORY_CARE_PROVIDER_SITE_OTHER): Payer: Medicare HMO | Admitting: Internal Medicine

## 2021-07-16 VITALS — BP 137/94 | HR 88 | Ht 70.0 in | Wt 205.1 lb

## 2021-07-16 DIAGNOSIS — R3911 Hesitancy of micturition: Secondary | ICD-10-CM | POA: Diagnosis not present

## 2021-07-16 DIAGNOSIS — E8881 Metabolic syndrome: Secondary | ICD-10-CM

## 2021-07-16 DIAGNOSIS — R0602 Shortness of breath: Secondary | ICD-10-CM

## 2021-07-16 DIAGNOSIS — N401 Enlarged prostate with lower urinary tract symptoms: Secondary | ICD-10-CM | POA: Diagnosis not present

## 2021-07-16 DIAGNOSIS — Z1211 Encounter for screening for malignant neoplasm of colon: Secondary | ICD-10-CM

## 2021-07-16 DIAGNOSIS — D649 Anemia, unspecified: Secondary | ICD-10-CM

## 2021-07-16 DIAGNOSIS — K2930 Chronic superficial gastritis without bleeding: Secondary | ICD-10-CM | POA: Diagnosis not present

## 2021-07-16 DIAGNOSIS — G629 Polyneuropathy, unspecified: Secondary | ICD-10-CM | POA: Diagnosis not present

## 2021-07-16 DIAGNOSIS — K449 Diaphragmatic hernia without obstruction or gangrene: Secondary | ICD-10-CM | POA: Diagnosis not present

## 2021-07-16 DIAGNOSIS — I10 Essential (primary) hypertension: Secondary | ICD-10-CM

## 2021-07-16 MED ORDER — TAMSULOSIN HCL 0.4 MG PO CAPS: 0.4000 mg | ORAL_CAPSULE | Freq: Every day | ORAL | 3 refills | Status: DC

## 2021-07-16 MED ORDER — LOSARTAN POTASSIUM-HCTZ 100-25 MG PO TABS: 1.0000 | ORAL_TABLET | Freq: Every day | ORAL | 0 refills | Status: DC

## 2021-07-16 NOTE — Assessment & Plan Note (Signed)

## 2021-07-16 NOTE — Assessment & Plan Note (Signed)

## 2021-07-16 NOTE — Assessment & Plan Note (Signed)
He denies any history of nausea vomiting of any blood.  Superficial gastritis was noted in the past endoscopy.  He is taking Prilosec.  There is no history of black stool.  We will schedule him for colonoscopy and follow-up with the gastroenterology to find out the cause of his anemia architecture

## 2021-07-16 NOTE — Assessment & Plan Note (Signed)
We will continue.  lyrica as directed

## 2021-07-16 NOTE — Assessment & Plan Note (Signed)
Starting Flomax. 

## 2021-07-16 NOTE — Assessment & Plan Note (Signed)
We will send to gastroenterology for evaluation hemoglobin 7.8

## 2021-07-16 NOTE — Progress Notes (Signed)
Established Patient Office Visit  Subjective:  Patient ID: Zachary Taylor, male    DOB: 05-25-1951  Age: 70 y.o. MRN: [id number]  CC:  Chief Complaint  Patient presents with   Anemia    Anemia   Zachary Taylor presents forevaluation of anemia  Past Medical History:  Diagnosis Date   Hypertension     History reviewed. No pertinent surgical history.  History reviewed. No pertinent family history.  Social History   Socioeconomic History   Marital status: Married    Spouse name: Not on file   Number of children: Not on file   Years of education: Not on file   Highest education level: Not on file  Occupational History   Not on file  Tobacco Use   Smoking status: Never   Smokeless tobacco: Never  Substance and Sexual Activity   Alcohol use: No   Drug use: No   Sexual activity: Not on file  Other Topics Concern   Not on file  Social History Narrative   Not on file   Social Determinants of Health   Financial Resource Strain: Not on file  Food Insecurity: Not on file  Transportation Needs: Not on file  Physical Activity: Not on file  Stress: Not on file  Social Connections: Not on file  Intimate Partner Violence: Not on file     Current Outpatient Medications:    cyclobenzaprine (FLEXERIL) 10 MG tablet, Take 1 tablet (10 mg total) by mouth 2 (two) times daily as needed for muscle spasms., Disp: 60 tablet, Rfl: 0   gabapentin (NEURONTIN) 100 MG capsule, Take 1 capsule (100 mg total) by mouth 3 (three) times daily., Disp: 90 capsule, Rfl: 3   omeprazole (PRILOSEC) 20 MG capsule, Take 1 capsule (20 mg total) by mouth daily., Disp: 30 capsule, Rfl: 3   pregabalin (LYRICA) 50 MG capsule, Take 1 capsule (50 mg total) by mouth 3 (three) times daily., Disp: 90 capsule, Rfl: 2   RABEprazole (ACIPHEX) 20 MG tablet, Take 1 tablet (20 mg total) by mouth daily., Disp: 30 tablet, Rfl: 4   tadalafil (CIALIS) 20 MG tablet, Take 0.5-1 tablets (10-20 mg total) by mouth every other day  as needed for erectile dysfunction., Disp: 5 tablet, Rfl: 11   losartan-hydrochlorothiazide (HYZAAR) 100-25 MG tablet, Take 1 tablet by mouth daily., Disp: 90 tablet, Rfl: 0   No Known Allergies  ROS Review of Systems  Constitutional: Negative.   HENT: Negative.    Eyes: Negative.   Respiratory: Negative.    Cardiovascular: Negative.   Gastrointestinal: Negative.   Endocrine: Negative.   Genitourinary: Negative.   Musculoskeletal: Negative.   Skin: Negative.   Allergic/Immunologic: Negative.   Neurological: Negative.   Hematological: Negative.   Psychiatric/Behavioral: Negative.    All other systems reviewed and are negative.    Objective:    Physical Exam Vitals reviewed.  Constitutional:      Appearance: Normal appearance.  HENT:     Mouth/Throat:     Mouth: Mucous membranes are moist.  Eyes:     Pupils: Pupils are equal, round, and reactive to light.  Neck:     Vascular: No carotid bruit.  Cardiovascular:     Rate and Rhythm: Normal rate and regular rhythm.     Pulses: Normal pulses.     Heart sounds: Normal heart sounds.  Pulmonary:     Effort: Pulmonary effort is normal.     Breath sounds: Normal breath sounds.  Abdominal:     General:  Bowel sounds are normal.     Palpations: Abdomen is soft. There is no hepatomegaly, splenomegaly or mass.     Tenderness: There is no abdominal tenderness.     Hernia: No hernia is present.  Musculoskeletal:     Cervical back: Neck supple.     Right lower leg: No edema.     Left lower leg: No edema.  Skin:    Findings: No rash.  Neurological:     Mental Status: He is alert and oriented to person, place, and time.     Motor: No weakness.  Psychiatric:        Mood and Affect: Mood normal.        Behavior: Behavior normal.    BP (!) 137/94   Pulse 88   Ht 5' 10"  (1.778 m)   Wt 205 lb 1.6 oz (93 kg)   BMI 29.43 kg/m  Wt Readings from Last 3 Encounters:  07/16/21 205 lb 1.6 oz (93 kg)  07/03/21 207 lb 4.8 oz (94  kg)  12/25/20 206 lb 9.6 oz (93.7 kg)     Health Maintenance Due  Topic Date Due   Hepatitis C Screening  Never done   TETANUS/TDAP  Never done   COLONOSCOPY (Pts 45-66yr Insurance coverage will need to be confirmed)  Never done   Zoster Vaccines- Shingrix (1 of 2) Never done   PNA vac Low Risk Adult (1 of 2 - PCV13) Never done   COVID-19 Vaccine (4 - Booster for Pfizer series) 03/24/2021    There are no preventive care reminders to display for this patient.  Lab Results  Component Value Date   TSH 2.00 07/09/2021   Lab Results  Component Value Date   WBC 5.3 07/09/2021   HGB 7.8 (L) 07/09/2021   HCT 29.1 (L) 07/09/2021   MCV 65.8 (L) 07/09/2021   PLT 305 07/09/2021   Lab Results  Component Value Date   NA 138 07/09/2021   K 4.2 07/09/2021   CO2 23 07/09/2021   GLUCOSE 99 07/09/2021   BUN 16 07/09/2021   CREATININE 1.12 07/09/2021   BILITOT 0.8 07/09/2021   AST 21 07/09/2021   ALT 19 07/09/2021   PROT 7.2 07/09/2021   CALCIUM 9.1 07/09/2021   ANIONGAP 10 03/19/2017   EGFR 72 07/09/2021   Lab Results  Component Value Date   CHOL 150 07/09/2021   Lab Results  Component Value Date   HDL 36 (L) 07/09/2021   Lab Results  Component Value Date   LDLCALC 92 07/09/2021   Lab Results  Component Value Date   TRIG 120 07/09/2021   Lab Results  Component Value Date   CHOLHDL 4.2 07/09/2021   No results found for: HGBA1C    Assessment & Plan:   Problem List Items Addressed This Visit       Cardiovascular and Mediastinum   Essential hypertension     Patient denies any chest pain or shortness of breath there is no history of palpitation or paroxysmal nocturnal dyspnea   patient was advised to follow low-salt low-cholesterol diet    ideally I want to keep systolic blood pressure below 130 mmHg, patient was asked to check blood pressure one times a week and give me a report on that.  Patient will be follow-up in 3 months  or earlier as needed, patient will  call me back for any change in the cardiovascular symptoms Patient was advised to buy a book from local bookstore concerning blood pressure  and read several chapters  every day.  This will be supplemented by some of the material we will give him from the office.  Patient should also utilize other resources like YouTube and Internet to learn more about the blood pressure and the diet.       Relevant Medications   losartan-hydrochlorothiazide (HYZAAR) 100-25 MG tablet     Digestive   Superficial gastritis without hemorrhage    We will send to gastroenterology for evaluation hemoglobin 7.8         Nervous and Auditory   Neuropathy    We will continue.  lyrica as directed         Genitourinary   Benign prostatic hyperplasia with urinary hesitancy    Starting Flomax         Other   Metabolic syndrome    - I encouraged the patient to lose weight.  - I educated them on making healthy dietary choices including eating more fruits and vegetables and less fried foods. - I encouraged the patient to exercise more, and educated on the benefits of exercise including weight loss, diabetes prevention, and hypertension prevention.   Dietary counseling with a registered dietician  Referral to a weight management support group (e.g. Weight Watchers, Overeaters Anonymous)  If your BMI is greater than 29 or you have gained more than 15 pounds you should work on weight loss.  Attend a healthy cooking class        Anemia - Primary    He denies any history of nausea vomiting of any blood.  Superficial gastritis was noted in the past endoscopy.  He is taking Prilosec.  There is no history of black stool.  We will schedule him for colonoscopy and follow-up with the gastroenterology to find out the cause of his anemia architecture       Relevant Orders   CBC with Differential/Platelet    Meds ordered this encounter  Medications   losartan-hydrochlorothiazide (HYZAAR) 100-25 MG tablet     Sig: Take 1 tablet by mouth daily.    Dispense:  90 tablet    Refill:  0    Follow-up: No follow-ups on file.    Cletis Athens, MD

## 2021-07-17 LAB — CBC WITH DIFFERENTIAL/PLATELET
Absolute Monocytes: 634 cells/uL (ref 200–950)
Basophils Absolute: 51 cells/uL (ref 0–200)
Basophils Relative: 0.8 %
Eosinophils Absolute: 262 cells/uL (ref 15–500)
Eosinophils Relative: 4.1 %
HCT: 29.7 % — ABNORMAL LOW (ref 38.5–50.0)
Hemoglobin: 8.2 g/dL — ABNORMAL LOW (ref 13.2–17.1)
Lymphs Abs: 1344 cells/uL (ref 850–3900)
MCH: 17.7 pg — ABNORMAL LOW (ref 27.0–33.0)
MCHC: 27.6 g/dL — ABNORMAL LOW (ref 32.0–36.0)
MCV: 64.3 fL — ABNORMAL LOW (ref 80.0–100.0)
MPV: 10.6 fL (ref 7.5–12.5)
Monocytes Relative: 9.9 %
Neutro Abs: 4109 cells/uL (ref 1500–7800)
Neutrophils Relative %: 64.2 %
Platelets: 374 10*3/uL (ref 140–400)
RBC: 4.62 10*6/uL (ref 4.20–5.80)
RDW: 21.2 % — ABNORMAL HIGH (ref 11.0–15.0)
Total Lymphocyte: 21 %
WBC: 6.4 10*3/uL (ref 3.8–10.8)

## 2021-07-17 NOTE — Addendum Note (Signed)
Addended by: Melody Comas L on: 07/17/2021 10:01 AM   Modules accepted: Orders

## 2021-07-19 ENCOUNTER — Encounter: Payer: Self-pay | Admitting: Oncology

## 2021-07-19 ENCOUNTER — Inpatient Hospital Stay: Payer: Medicare HMO

## 2021-07-19 ENCOUNTER — Inpatient Hospital Stay: Payer: Medicare HMO | Attending: Oncology | Admitting: Oncology

## 2021-07-19 VITALS — BP 109/76 | HR 81 | Temp 98.4°F | Resp 16 | Ht 70.0 in | Wt 198.0 lb

## 2021-07-19 DIAGNOSIS — D649 Anemia, unspecified: Secondary | ICD-10-CM | POA: Diagnosis not present

## 2021-07-19 DIAGNOSIS — K219 Gastro-esophageal reflux disease without esophagitis: Secondary | ICD-10-CM | POA: Diagnosis not present

## 2021-07-19 DIAGNOSIS — D509 Iron deficiency anemia, unspecified: Secondary | ICD-10-CM | POA: Diagnosis not present

## 2021-07-19 DIAGNOSIS — N400 Enlarged prostate without lower urinary tract symptoms: Secondary | ICD-10-CM | POA: Diagnosis not present

## 2021-07-19 DIAGNOSIS — Z79899 Other long term (current) drug therapy: Secondary | ICD-10-CM | POA: Diagnosis not present

## 2021-07-19 DIAGNOSIS — I10 Essential (primary) hypertension: Secondary | ICD-10-CM | POA: Diagnosis not present

## 2021-07-19 DIAGNOSIS — N529 Male erectile dysfunction, unspecified: Secondary | ICD-10-CM | POA: Insufficient documentation

## 2021-07-19 LAB — COMPREHENSIVE METABOLIC PANEL
ALT: 18 U/L (ref 0–44)
AST: 21 U/L (ref 15–41)
Albumin: 4.1 g/dL (ref 3.5–5.0)
Alkaline Phosphatase: 59 U/L (ref 38–126)
Anion gap: 9 (ref 5–15)
BUN: 18 mg/dL (ref 8–23)
CO2: 23 mmol/L (ref 22–32)
Calcium: 8.8 mg/dL — ABNORMAL LOW (ref 8.9–10.3)
Chloride: 101 mmol/L (ref 98–111)
Creatinine, Ser: 1.02 mg/dL (ref 0.61–1.24)
GFR, Estimated: >60 mL/min (ref >60)
Glucose, Bld: 110 mg/dL — ABNORMAL HIGH (ref 70–99)
Potassium: 3.7 mmol/L (ref 3.5–5.1)
Sodium: 133 mmol/L — ABNORMAL LOW (ref 135–145)
Total Bilirubin: 0.4 mg/dL (ref 0.3–1.2)
Total Protein: 7.5 g/dL (ref 6.5–8.1)

## 2021-07-19 LAB — CBC WITH DIFFERENTIAL/PLATELET
Abs Immature Granulocytes: 0.02 10*3/uL (ref 0.00–0.07)
Basophils Absolute: 0.1 10*3/uL (ref 0.0–0.1)
Basophils Relative: 1 %
Eosinophils Absolute: 0.2 10*3/uL (ref 0.0–0.5)
Eosinophils Relative: 3 %
HCT: 28.8 % — ABNORMAL LOW (ref 39.0–52.0)
Hemoglobin: 8.3 g/dL — ABNORMAL LOW (ref 13.0–17.0)
Immature Granulocytes: 0 %
Lymphocytes Relative: 24 %
Lymphs Abs: 1.3 10*3/uL (ref 0.7–4.0)
MCH: 18.4 pg — ABNORMAL LOW (ref 26.0–34.0)
MCHC: 28.8 g/dL — ABNORMAL LOW (ref 30.0–36.0)
MCV: 63.7 fL — ABNORMAL LOW (ref 80.0–100.0)
Monocytes Absolute: 0.5 10*3/uL (ref 0.1–1.0)
Monocytes Relative: 9 %
Neutro Abs: 3.4 10*3/uL (ref 1.7–7.7)
Neutrophils Relative %: 63 %
Platelets: 327 10*3/uL (ref 150–400)
RBC: 4.52 MIL/uL (ref 4.22–5.81)
RDW: 23.1 % — ABNORMAL HIGH (ref 11.5–15.5)
WBC: 5.4 10*3/uL (ref 4.0–10.5)
nRBC: 0 % (ref 0.0–0.2)

## 2021-07-19 LAB — IRON AND TIBC
Iron: 18 ug/dL — ABNORMAL LOW (ref 45–182)
Saturation Ratios: 3 % — ABNORMAL LOW (ref 17.9–39.5)
TIBC: 605 ug/dL — ABNORMAL HIGH (ref 250–450)
UIBC: 587 ug/dL

## 2021-07-19 LAB — VITAMIN B12: Vitamin B-12: 338 pg/mL (ref 180–914)

## 2021-07-19 LAB — FOLATE: Folate: 15.5 ng/mL (ref >5.9)

## 2021-07-19 LAB — FERRITIN: Ferritin: 8 ng/mL — ABNORMAL LOW (ref 24–336)

## 2021-07-19 NOTE — Progress Notes (Signed)
Established Patient Office Visit  Subjective:  Patient ID: Zachary Taylor, male    DOB: May 12, 1951  Age: 70 y.o. MRN: [id number]  CC: No chief complaint on file.   HPI  [name] presents for check up  Past Medical History:  Diagnosis Date   Hypertension     History reviewed. No pertinent surgical history.  History reviewed. No pertinent family history.  Social History   Socioeconomic History   Marital status: Married    Spouse name: Not on file   Number of children: Not on file   Years of education: Not on file   Highest education level: Not on file  Occupational History   Not on file  Tobacco Use   Smoking status: Never   Smokeless tobacco: Never  Substance and Sexual Activity   Alcohol use: No   Drug use: No   Sexual activity: Not on file  Other Topics Concern   Not on file  Social History Narrative   Not on file   Social Determinants of Health   Financial Resource Strain: Not on file  Food Insecurity: Not on file  Transportation Needs: Not on file  Physical Activity: Not on file  Stress: Not on file  Social Connections: Not on file  Intimate Partner Violence: Not on file     Current Outpatient Medications:    Cholecalciferol (VITAMIN D3 PO), Take 1 tablet by mouth daily., Disp: , Rfl:    ferrous sulfate 325 (65 FE) MG EC tablet, Take 325 mg by mouth daily with breakfast., Disp: , Rfl:    gabapentin (NEURONTIN) 100 MG capsule, Take 1 capsule (100 mg total) by mouth 3 (three) times daily. (Patient not taking: No sig reported), Disp: 90 capsule, Rfl: 3   losartan-hydrochlorothiazide (HYZAAR) 100-25 MG tablet, TAKE 1 TABLET BY MOUTH EVERY DAY, Disp: 90 tablet, Rfl: 0   Multiple Vitamin (MULTIVITAMIN WITH MINERALS) TABS tablet, Take 1 tablet by mouth daily., Disp: , Rfl:    omeprazole (PRILOSEC) 20 MG capsule, Take 1 capsule (20 mg total) by mouth daily. (Patient not taking: No sig reported), Disp: 30 capsule, Rfl: 3   polyethylene glycol-electrolytes  (TRILYTE) 420 g solution, Take 4,000 mLs by mouth as directed., Disp: 4000 mL, Rfl: 0   pregabalin (LYRICA) 50 MG capsule, Take 1 capsule (50 mg total) by mouth 3 (three) times daily. (Patient taking differently: Take 50 mg by mouth in the morning.), Disp: 90 capsule, Rfl: 2   RABEprazole (ACIPHEX) 20 MG tablet, Take 1 tablet (20 mg total) by mouth daily. (Patient not taking: No sig reported), Disp: 30 tablet, Rfl: 4   tadalafil (CIALIS) 20 MG tablet, Take 0.5-1 tablets (10-20 mg total) by mouth every other day as needed for erectile dysfunction. (Patient not taking: No sig reported), Disp: 5 tablet, Rfl: 11   tamsulosin (FLOMAX) 0.4 MG CAPS capsule, Take 1 capsule (0.4 mg total) by mouth daily. (Patient not taking: No sig reported), Disp: 30 capsule, Rfl: 3   triamcinolone cream (KENALOG) 0.5 %, Apply 1 application topically 3 (three) times daily. (Patient not taking: No sig reported), Disp: 30 g, Rfl: 0   No Known Allergies  ROS Review of Systems  Constitutional: Negative.   HENT: Negative.    Eyes: Negative.   Respiratory: Negative.    Cardiovascular: Negative.   Gastrointestinal: Negative.   Endocrine: Negative.   Genitourinary: Negative.   Musculoskeletal: Negative.   Skin: Negative.   Allergic/Immunologic: Negative.   Neurological: Negative.   Hematological: Negative.  Psychiatric/Behavioral: Negative.    All other systems reviewed and are negative.    Objective:    Physical Exam Vitals reviewed.  Constitutional:      Appearance: Normal appearance.  HENT:     Mouth/Throat:     Mouth: Mucous membranes are moist.  Eyes:     Pupils: Pupils are equal, round, and reactive to light.  Neck:     Vascular: No carotid bruit.  Cardiovascular:     Rate and Rhythm: Normal rate and regular rhythm.     Pulses: Normal pulses.     Heart sounds: Normal heart sounds.  Pulmonary:     Effort: Pulmonary effort is normal.     Breath sounds: Normal breath sounds.  Abdominal:      General: Bowel sounds are normal.     Palpations: Abdomen is soft. There is no hepatomegaly, splenomegaly or mass.     Tenderness: There is no abdominal tenderness.     Hernia: No hernia is present.  Musculoskeletal:     Cervical back: Neck supple.     Right lower leg: No edema.     Left lower leg: No edema.  Skin:    Findings: No rash.  Neurological:     Mental Status: He is alert and oriented to person, place, and time.     Motor: No weakness.  Psychiatric:        Mood and Affect: Mood normal.        Behavior: Behavior normal.    There were no vitals taken for this visit. Wt Readings from Last 3 Encounters:  09/13/21 202 lb (91.6 kg)  07/19/21 198 lb (89.8 kg)  07/16/21 205 lb 1.6 oz (93 kg)     Health Maintenance Due  Topic Date Due   Hepatitis C Screening  Never done   TETANUS/TDAP  Never done   COLONOSCOPY (Pts 45-33yrs Insurance coverage will need to be confirmed)  Never done   Zoster Vaccines- Shingrix (1 of 2) Never done   INFLUENZA VACCINE  Never done    There are no preventive care reminders to display for this patient.  Lab Results  Component Value Date   TSH 2.00 07/09/2021   Lab Results  Component Value Date   WBC 5.4 07/19/2021   HGB 8.3 (L) 07/19/2021   HCT 28.8 (L) 07/19/2021   MCV 63.7 (L) 07/19/2021   PLT 327 07/19/2021   Lab Results  Component Value Date   NA 133 (L) 07/19/2021   K 3.7 07/19/2021   CO2 23 07/19/2021   GLUCOSE 110 (H) 07/19/2021   BUN 18 07/19/2021   CREATININE 1.02 07/19/2021   BILITOT 0.4 07/19/2021   ALKPHOS 59 07/19/2021   AST 21 07/19/2021   ALT 18 07/19/2021   PROT 7.5 07/19/2021   ALBUMIN 4.1 07/19/2021   CALCIUM 8.8 (L) 07/19/2021   ANIONGAP 9 07/19/2021   EGFR 72 07/09/2021   Lab Results  Component Value Date   CHOL 150 07/09/2021   Lab Results  Component Value Date   HDL 36 (L) 07/09/2021   Lab Results  Component Value Date   LDLCALC 92 07/09/2021   Lab Results  Component Value Date   TRIG  120 07/09/2021   Lab Results  Component Value Date   CHOLHDL 4.2 07/09/2021   No results found for: HGBA1C    Assessment & Plan:   Problem List Items Addressed This Visit       Cardiovascular and Mediastinum   Essential hypertension  Patient denies any chest pain or shortness of breath there is no history of palpitation or paroxysmal nocturnal dyspnea   patient was advised to follow low-salt low-cholesterol diet    ideally I want to keep systolic blood pressure below 130 mmHg, patient was asked to check blood pressure one times a week and give me a report on that.  Patient will be follow-up in 3 months  or earlier as needed, patient will call me back for any change in the cardiovascular symptoms Patient was advised to buy a book from local bookstore concerning blood pressure and read several chapters  every day.  This will be supplemented by some of the material we will give him from the office.  Patient should also utilize other resources like YouTube and Internet to learn more about the blood pressure and the diet.      Relevant Orders   COMPLETE METABOLIC PANEL WITH GFR (Completed)   CBC MORPHOLOGY (Completed)     Digestive   Gastroesophageal reflux disease with esophagitis without hemorrhage    - The patient's GERD is stable on medication.  - Instructed the patient to avoid eating spicy and acidic foods, as well as foods high in fat. - Instructed the patient to avoid eating large meals or meals 2-3 hours prior to sleeping.        Nervous and Auditory   Neuropathy   Relevant Orders   CBC with Differential/Platelet (Completed)   COMPLETE METABOLIC PANEL WITH GFR (Completed)   TSH (Completed)     Other   Erectile dysfunction - Primary    Intermittent problem      Relevant Orders   PSA (Completed)   Testosterone (Completed)   Metabolic syndrome    Patient was advised to lose weight      Relevant Orders   COMPLETE METABOLIC PANEL WITH GFR (Completed)   Lipid  panel (Completed)   Other Visit Diagnoses     Shortness of breath       Relevant Orders   DG Chest 2 View (Completed)   Colon cancer screening       Relevant Orders   CBC MORPHOLOGY (Completed)       No orders of the defined types were placed in this encounter.   Follow-up: No follow-ups on file.    Cletis Athens, MD t

## 2021-07-19 NOTE — Progress Notes (Signed)
[facility]  Telephone:([phone]) [phone] Fax:(336) [phone]  ID: Zachary Taylor OB: 11-Oct-1951  MR#: [id number]  QHU#:[id number]  Patient Care Team: Corky Downs, MD as PCP - General (Internal Medicine)  CHIEF COMPLAINT: Anemia  PMH-  Zachary Taylor is a 70 year old male with past medical history significant for hypertension, pharyngitis, GERD, neuropathy, BPH, erectile dysfunction and seasonal allergic rhinitis who was referred to hematology for acute onset anemia.  Per chart review, his hemoglobin began to trend down in June 2021.  Hemoglobin has ranged from 7.8-12 over the past year.  He was noted to have superficial gastritis on his last endoscopy.  He is taking Prilosec.  He denies any history of black tarry stools.  Plan is to repeat colonoscopy in the near future.  He was referred back to GI.  He is scheduled to see Zachary Taylor on 09/13/2021.  He denies any recent blood donation or transfusion.  Denies any GI bleeding including melena, hematochezia or bright red blood per rectum.  He denies any history of chronic kidney disease.  He eats a normal diet with a variety of fruits vegetables and meats.  He is taking iron supplements once per day.   Interval History- Today Zachary Taylor  is feeling well.  He recently had an x-ray for shortness of breath which showed bilateral atelectasis scarring with no definite evidence of acute cardiopulmonary disease. Tolerating oral iron well. Works full time 10-12 hour work days.  Reports no increased fatigue or weakness.  REVIEW OF SYSTEMS:   Review of Systems  Constitutional: Negative.  Negative for chills, fever, malaise/fatigue and weight loss.  HENT:  Negative for congestion, ear pain and tinnitus.   Eyes: Negative.  Negative for blurred vision and double vision.  Respiratory: Negative.  Negative for cough, sputum production and shortness of breath.   Cardiovascular: Negative.  Negative for chest pain, palpitations and leg swelling.   Gastrointestinal: Negative.  Negative for abdominal pain, constipation, diarrhea, nausea and vomiting.  Genitourinary:  Negative for dysuria, frequency and urgency.  Musculoskeletal:  Negative for back pain and falls.  Skin: Negative.  Negative for rash.  Neurological: Negative.  Negative for weakness and headaches.  Endo/Heme/Allergies: Negative.  Does not bruise/bleed easily.  Psychiatric/Behavioral: Negative.  Negative for depression. The patient is not nervous/anxious and does not have insomnia.    As per HPI. Otherwise, a complete review of systems is negative.  PAST MEDICAL HISTORY: Past Medical History:  Diagnosis Date   Hypertension     PAST SURGICAL HISTORY: No past surgical history on file.  FAMILY HISTORY: No family history on file.  ADVANCED DIRECTIVES (Y/N):  N  HEALTH MAINTENANCE: Social History   Tobacco Use   Smoking status: Never   Smokeless tobacco: Never  Substance Use Topics   Alcohol use: No   Drug use: No     Colonoscopy:  PAP:  Bone density:  Lipid panel:  No Known Allergies  Current Outpatient Medications  Medication Sig Dispense Refill   cyclobenzaprine (FLEXERIL) 10 MG tablet Take 1 tablet (10 mg total) by mouth 2 (two) times daily as needed for muscle spasms. 60 tablet 0   gabapentin (NEURONTIN) 100 MG capsule Take 1 capsule (100 mg total) by mouth 3 (three) times daily. 90 capsule 3   losartan-hydrochlorothiazide (HYZAAR) 100-25 MG tablet Take 1 tablet by mouth daily. 90 tablet 0   omeprazole (PRILOSEC) 20 MG capsule Take 1 capsule (20 mg total) by mouth daily. 30 capsule 3   pregabalin (LYRICA) 50 MG  capsule Take 1 capsule (50 mg total) by mouth 3 (three) times daily. 90 capsule 2   RABEprazole (ACIPHEX) 20 MG tablet Take 1 tablet (20 mg total) by mouth daily. 30 tablet 4   tadalafil (CIALIS) 20 MG tablet Take 0.5-1 tablets (10-20 mg total) by mouth every other day as needed for erectile dysfunction. 5 tablet 11   tamsulosin (FLOMAX)  0.4 MG CAPS capsule Take 1 capsule (0.4 mg total) by mouth daily. 30 capsule 3   No current facility-administered medications for this visit.    OBJECTIVE: There were no vitals filed for this visit.   There is no height or weight on file to calculate BMI.    ECOG FS:0 - Asymptomatic  Physical Exam Constitutional:      Appearance: Normal appearance.  HENT:     Head: Normocephalic and atraumatic.  Eyes:     Pupils: Pupils are equal, round, and reactive to light.  Cardiovascular:     Rate and Rhythm: Normal rate and regular rhythm.     Heart sounds: Normal heart sounds. No murmur heard. Pulmonary:     Effort: Pulmonary effort is normal.     Breath sounds: Normal breath sounds. No wheezing.  Abdominal:     General: Bowel sounds are normal. There is no distension.     Palpations: Abdomen is soft.     Tenderness: There is no abdominal tenderness.  Musculoskeletal:        General: Normal range of motion.     Cervical back: Normal range of motion.  Skin:    General: Skin is warm and dry.     Findings: No rash.  Neurological:     Mental Status: He is alert and oriented to person, place, and time.  Psychiatric:        Judgment: Judgment normal.      LAB RESULTS:  Lab Results  Component Value Date   NA 138 07/09/2021   K 4.2 07/09/2021   CL 105 07/09/2021   CO2 23 07/09/2021   GLUCOSE 99 07/09/2021   BUN 16 07/09/2021   CREATININE 1.12 07/09/2021   CALCIUM 9.1 07/09/2021   PROT 7.2 07/09/2021   AST 21 07/09/2021   ALT 19 07/09/2021   BILITOT 0.8 07/09/2021   GFRNONAA 74 05/23/2020   GFRAA 86 05/23/2020    Lab Results  Component Value Date   WBC 6.4 07/16/2021   NEUTROABS 4,109 07/16/2021   HGB 8.2 (L) 07/16/2021   HCT 29.7 (L) 07/16/2021   MCV 64.3 (L) 07/16/2021   PLT 374 07/16/2021     STUDIES: DG Chest 2 View  Result Date: 07/17/2021 CLINICAL DATA:  70 year old male with a history of annual checkup, shortness of breath EXAM: CHEST - 2 VIEW COMPARISON:   None. FINDINGS: Cardiomediastinal silhouette within normal limits in size and contour. No evidence of central vascular congestion. No interlobular septal thickening. Double density over the lower mediastinum. Minimal linear opacities at the lung bases with no confluent airspace disease. No pneumothorax or pleural effusion. No acute displaced fracture. Degenerative changes of the spine. IMPRESSION: Basilar atelectasis/scarring with no definite evidence of acute cardiopulmonary disease. Hiatal hernia Electronically Signed   By: Gilmer Mor D.O.   On: 07/17/2021 08:32    ASSESSMENT: Zachary Taylor is a 70 year old male who was seen for consultation of anemia.  PLAN:    Iron deficiency anemia- Unclear etiology. Most recent colonoscopy is negative for abnormality or bleeding.  Lab work from his PCP back in October 21 did  show a normal B12 and folate level.  I do not see a ferritin or iron panel recently.  He does not have any known chronic kidney disease.  Recommend full work-up to rule out mineral and vitamin deficiencies such as vitamin B12, folate, copper, MMA, vitamin D and ferritin/iron panel.  Recommend CBC and CMP.  Given he is extremely microcytic would recommend getting him set up for 5 doses of IV Venofer. Patient would like to discuss wit PCP and get back to me.   Disposition- Labs today. RTC for 5 doses of IV Venofer. RTC in 8 to 10 weeks for repeat lab work, NP assessment and possible IV iron.  Addendum-spoke with patient via phone and given he is asymptomatic he would like to hold off on IV iron at this time.  He also would like to communicate with his PCP regarding plan moving forward.  I asked him to give [place] a call if he would like IV iron versus taking with oral iron.  Recommend repeat lab work in about 3 months to see if oral iron is working.  Patient expressed understanding and was in agreement with this plan. He also understands that He can call clinic at any time with any questions,  concerns, or complaints.   Cancer Staging No matching staging information was found for the patient.  Mauro Kaufmann, NP   07/19/2021 10:30 AM

## 2021-07-20 ENCOUNTER — Telehealth: Payer: Self-pay | Admitting: Oncology

## 2021-07-20 LAB — COPPER, SERUM: Copper: 126 ug/dL (ref 69–132)

## 2021-07-20 NOTE — Telephone Encounter (Signed)
Per conversation with Mignon Pine. Do not schedule patient for Venofer x 5. Pt would like to discuss with his PCP first.

## 2021-07-22 LAB — METHYLMALONIC ACID, SERUM: Methylmalonic Acid, Quantitative: 145 nmol/L (ref 0–378)

## 2021-07-23 ENCOUNTER — Telehealth: Payer: Self-pay | Admitting: *Deleted

## 2021-07-24 NOTE — Telephone Encounter (Signed)
Is he interested in IV iron? I tried calling him to get him scheduled and he did not want to make any appointments.   Durenda Hurt, NP 07/24/2021 10:05 AM

## 2021-07-24 NOTE — Telephone Encounter (Signed)
He did not say, only that he needed to sperak with you about his low iron and something else that was found at his appointment with Dr Juel Burrow. When did you speak with him about iron infusion appointment, today or previously?

## 2021-07-24 NOTE — Telephone Encounter (Addendum)
Patient called reporting that he needs an appointment with Boneta Lucks about low iron and something that was found at his visit with Dr Juel Burrow

## 2021-07-25 ENCOUNTER — Other Ambulatory Visit: Payer: Self-pay | Admitting: Oncology

## 2021-07-25 NOTE — Progress Notes (Signed)
Re-labs  Spoke with patient and he is agreeable to receive IV iron.  Schedule message sent to have him scheduled.  He would prefer late afternoon appointments if possible.  Keep scheduled follow-up in 8 to 10 weeks.    Durenda Hurt, NP 07/25/2021 8:53 AM

## 2021-07-26 ENCOUNTER — Encounter: Payer: Self-pay | Admitting: Oncology

## 2021-07-30 ENCOUNTER — Inpatient Hospital Stay: Payer: Medicare HMO | Attending: Oncology

## 2021-07-30 VITALS — BP 121/83 | HR 68 | Temp 97.2°F | Resp 20

## 2021-07-30 DIAGNOSIS — D509 Iron deficiency anemia, unspecified: Secondary | ICD-10-CM | POA: Insufficient documentation

## 2021-07-30 MED ORDER — SODIUM CHLORIDE 0.9 % IV SOLN: 200.0000 mg | Freq: Once | INTRAVENOUS | Status: DC

## 2021-07-30 MED ORDER — IRON SUCROSE 20 MG/ML IV SOLN
200.0000 mg | Freq: Once | INTRAVENOUS | Status: AC
  Administered 2021-07-30: 200 mg via INTRAVENOUS
  Filled 2021-07-30: qty 10

## 2021-07-30 MED ORDER — SODIUM CHLORIDE 0.9 % IV SOLN
Freq: Once | INTRAVENOUS | Status: AC
  Filled 2021-07-30: qty 250

## 2021-07-30 NOTE — Patient Instructions (Signed)

## 2021-07-31 ENCOUNTER — Ambulatory Visit: Payer: Medicare HMO

## 2021-08-02 ENCOUNTER — Inpatient Hospital Stay: Payer: Medicare HMO

## 2021-08-02 ENCOUNTER — Ambulatory Visit: Payer: Medicare HMO

## 2021-08-02 VITALS — BP 116/81 | HR 62 | Temp 98.0°F | Resp 18

## 2021-08-02 DIAGNOSIS — D509 Iron deficiency anemia, unspecified: Secondary | ICD-10-CM | POA: Diagnosis not present

## 2021-08-02 MED ORDER — IRON SUCROSE 20 MG/ML IV SOLN
200.0000 mg | Freq: Once | INTRAVENOUS | Status: AC
  Administered 2021-08-02: 200 mg via INTRAVENOUS
  Filled 2021-08-02: qty 10

## 2021-08-02 MED ORDER — SODIUM CHLORIDE 0.9 % IV SOLN: 200.0000 mg | Freq: Once | INTRAVENOUS | Status: DC

## 2021-08-02 MED ORDER — SODIUM CHLORIDE 0.9 % IV SOLN
Freq: Once | INTRAVENOUS | Status: AC
  Filled 2021-08-02: qty 250

## 2021-08-02 NOTE — Patient Instructions (Signed)

## 2021-08-06 ENCOUNTER — Inpatient Hospital Stay: Payer: Medicare HMO

## 2021-08-06 VITALS — BP 125/85 | HR 72 | Temp 97.9°F | Resp 18

## 2021-08-06 DIAGNOSIS — D509 Iron deficiency anemia, unspecified: Secondary | ICD-10-CM

## 2021-08-06 MED ORDER — IRON SUCROSE 20 MG/ML IV SOLN
200.0000 mg | Freq: Once | INTRAVENOUS | Status: AC
  Administered 2021-08-06: 200 mg via INTRAVENOUS

## 2021-08-06 MED ORDER — SODIUM CHLORIDE 0.9 % IV SOLN
Freq: Once | INTRAVENOUS | Status: AC
  Filled 2021-08-06: qty 250

## 2021-08-06 MED ORDER — SODIUM CHLORIDE 0.9 % IV SOLN: 200.0000 mg | Freq: Once | INTRAVENOUS | Status: DC

## 2021-08-07 ENCOUNTER — Ambulatory Visit: Payer: Medicare HMO

## 2021-08-09 ENCOUNTER — Inpatient Hospital Stay: Payer: Medicare HMO

## 2021-08-09 ENCOUNTER — Other Ambulatory Visit: Payer: Self-pay

## 2021-08-09 VITALS — BP 109/82 | HR 78 | Temp 98.8°F | Resp 16

## 2021-08-09 DIAGNOSIS — D509 Iron deficiency anemia, unspecified: Secondary | ICD-10-CM

## 2021-08-09 MED ORDER — SODIUM CHLORIDE 0.9 % IV SOLN: 200.0000 mg | Freq: Once | INTRAVENOUS | Status: DC

## 2021-08-09 MED ORDER — IRON SUCROSE 20 MG/ML IV SOLN
200.0000 mg | Freq: Once | INTRAVENOUS | Status: AC
  Administered 2021-08-09: 200 mg via INTRAVENOUS
  Filled 2021-08-09: qty 10

## 2021-08-09 MED ORDER — SODIUM CHLORIDE 0.9 % IV SOLN
Freq: Once | INTRAVENOUS | Status: AC
  Filled 2021-08-09: qty 250

## 2021-08-13 ENCOUNTER — Inpatient Hospital Stay: Payer: Medicare HMO

## 2021-08-13 ENCOUNTER — Other Ambulatory Visit: Payer: Self-pay

## 2021-08-13 VITALS — BP 108/72 | HR 76 | Temp 98.7°F

## 2021-08-13 DIAGNOSIS — D509 Iron deficiency anemia, unspecified: Secondary | ICD-10-CM | POA: Diagnosis not present

## 2021-08-13 MED ORDER — SODIUM CHLORIDE 0.9 % IV SOLN
Freq: Once | INTRAVENOUS | Status: AC
  Filled 2021-08-13: qty 250

## 2021-08-13 MED ORDER — IRON SUCROSE 20 MG/ML IV SOLN
200.0000 mg | Freq: Once | INTRAVENOUS | Status: AC
  Administered 2021-08-13: 200 mg via INTRAVENOUS
  Filled 2021-08-13: qty 10

## 2021-08-13 MED ORDER — SODIUM CHLORIDE 0.9 % IV SOLN: 200.0000 mg | Freq: Once | INTRAVENOUS | Status: DC

## 2021-08-13 NOTE — Patient Instructions (Signed)
[facility]  Discharge Instructions: Thank you for choosing [facility] to provide your oncology and hematology care.  If you have a lab appointment with the Cancer Center, please go directly to the Cancer Center and check in at the registration area.  Wear comfortable clothing and clothing appropriate for easy access to any Portacath or PICC line.   We strive to give you quality time with your provider. You may need to reschedule your appointment if you arrive late (15 or more minutes).  Arriving late affects you and other patients whose appointments are after yours.  Also, if you miss three or more appointments without notifying the office, you may be dismissed from the clinic at the provider's discretion.      For prescription refill requests, have your pharmacy contact our office and allow 72 hours for refills to be completed.    Today you received the following chemotherapy and/or immunotherapy agents:  Venofer   To help prevent nausea and vomiting after your treatment, we encourage you to take your nausea medication as directed.  BELOW ARE SYMPTOMS THAT SHOULD BE REPORTED IMMEDIATELY: *FEVER GREATER THAN 100.4 F (38 C) OR HIGHER *CHILLS OR SWEATING *NAUSEA AND VOMITING THAT IS NOT CONTROLLED WITH YOUR NAUSEA MEDICATION *UNUSUAL SHORTNESS OF BREATH *UNUSUAL BRUISING OR BLEEDING *URINARY PROBLEMS (pain or burning when urinating, or frequent urination) *BOWEL PROBLEMS (unusual diarrhea, constipation, pain near the anus) TENDERNESS IN MOUTH AND THROAT WITH OR WITHOUT PRESENCE OF ULCERS (sore throat, sores in mouth, or a toothache) UNUSUAL RASH, SWELLING OR PAIN  UNUSUAL VAGINAL DISCHARGE OR ITCHING   Items with * indicate a potential emergency and should be followed up as soon as possible or go to the Emergency Department if any problems should occur.  Please show the CHEMOTHERAPY ALERT CARD or IMMUNOTHERAPY ALERT CARD at check-in to  the Emergency Department and triage nurse.  Should you have questions after your visit or need to cancel or reschedule your appointment, please contact [facility]  [phone] and follow the prompts.  Office hours are 8:00 a.m. to 4:30 p.m. Monday - Friday. Please note that voicemails left after 4:00 p.m. may not be returned until the following business day.  We are closed weekends and major holidays. You have access to a nurse at all times for urgent questions. Please call the main number to the clinic [phone] and follow the prompts.  For any non-urgent questions, you may also contact your provider using MyChart. We now offer e-Visits for anyone 18 and older to request care online for non-urgent symptoms. For details visit [email or web address].   Also download the MyChart app! Go to the app store, search "MyChart", open the app, select [facility], and log in with your MyChart username and password.  Due to Covid, a mask is required upon entering the hospital/clinic. If you do not have a mask, one will be given to you upon arrival. For doctor visits, patients may have 1 support person aged 18 or older with them. For treatment visits, patients cannot have anyone with them due to current Covid guidelines and our immunocompromised population.  

## 2021-08-14 ENCOUNTER — Ambulatory Visit: Payer: Medicare HMO

## 2021-08-21 ENCOUNTER — Other Ambulatory Visit: Payer: Self-pay | Admitting: Internal Medicine

## 2021-08-22 ENCOUNTER — Other Ambulatory Visit: Payer: Self-pay | Admitting: *Deleted

## 2021-08-22 ENCOUNTER — Other Ambulatory Visit: Payer: Self-pay | Admitting: Internal Medicine

## 2021-08-22 DIAGNOSIS — G629 Polyneuropathy, unspecified: Secondary | ICD-10-CM

## 2021-08-22 DIAGNOSIS — I10 Essential (primary) hypertension: Secondary | ICD-10-CM

## 2021-08-22 MED ORDER — PREGABALIN 50 MG PO CAPS
50.0000 mg | ORAL_CAPSULE | Freq: Three times a day (TID) | ORAL | 2 refills | Status: DC
Start: 2021-08-22 — End: 2021-09-11

## 2021-08-22 MED ORDER — TRIAMCINOLONE ACETONIDE 0.5 % EX CREA: 1.0000 "application " | TOPICAL_CREAM | Freq: Three times a day (TID) | CUTANEOUS | 0 refills | Status: DC

## 2021-08-31 ENCOUNTER — Encounter: Payer: Self-pay | Admitting: Internal Medicine

## 2021-09-11 ENCOUNTER — Other Ambulatory Visit: Payer: Self-pay | Admitting: *Deleted

## 2021-09-11 DIAGNOSIS — G629 Polyneuropathy, unspecified: Secondary | ICD-10-CM

## 2021-09-11 MED ORDER — PREGABALIN 50 MG PO CAPS: 50.0000 mg | ORAL_CAPSULE | Freq: Three times a day (TID) | ORAL | 2 refills | Status: DC

## 2021-09-13 ENCOUNTER — Other Ambulatory Visit: Payer: Self-pay

## 2021-09-13 ENCOUNTER — Encounter: Payer: Self-pay | Admitting: Gastroenterology

## 2021-09-13 ENCOUNTER — Ambulatory Visit: Payer: Medicare HMO | Admitting: Gastroenterology

## 2021-09-13 VITALS — BP 123/83 | HR 89 | Temp 98.7°F | Ht 70.0 in | Wt 202.0 lb

## 2021-09-13 DIAGNOSIS — D500 Iron deficiency anemia secondary to blood loss (chronic): Secondary | ICD-10-CM | POA: Insufficient documentation

## 2021-09-13 DIAGNOSIS — D509 Iron deficiency anemia, unspecified: Secondary | ICD-10-CM | POA: Insufficient documentation

## 2021-09-13 NOTE — Progress Notes (Signed)
Arlyss Repress, MD [address]  [address]  [place], [place] [address]  Main: [phone]  Fax: [phone]    Gastroenterology Consultation  Referring Provider:     Corky Downs, MD Primary Care Physician:  Corky Downs, MD Primary Gastroenterologist:  Dr. Arlyss Repress Reason for Consultation:     Iron deficiency anemia        HPI:   Zachary Taylor is a 70 y.o. male referred by Dr. Corky Downs, MD  for consultation & management of chronic severe iron deficiency anemia.  Patient denies any GI symptoms.  He is referred to me by Dr. Juel Burrow to discuss about colonoscopy given his history of iron deficiency anemia.  Review of labs revealed that his last normal hemoglobin was 14.1 in 02/2017.  His hemoglobin dropped to 12 on 05/23/2020, found to have severe anemia on routine physical on 07/09/2021 hemoglobin 7.8, MCV 65.8, ferritin 8.  Patient is started on iron supplements as well as referred her to hematology.  Patient received parenteral iron therapy.  His B12 and folate levels are within normal limits.  TSH normal  Patient denies any GI symptoms whatsoever, denies abdominal pain, nausea, vomiting, melena, dark stools, rectal bleeding, hematochezia.  He had FIT testing that was negative recently.  He denies any abdominal surgeries.  He does not smoke or drink alcohol.  He denies any family history of GI malignancy  Patient is accompanied by his wife today.  When I try to explain to the patient regarding iron deficiency anemia, he said he feels fine other than restless legs.  He was prescribed gabapentin.  He is not convinced that IV iron is helping because he continues to have restless legs  NSAIDs: None  Antiplts/Anticoagulants/Anti thrombotics: None  GI Procedures: Reports having had an upper endoscopy and colonoscopy about 7 to 8 years ago and reportedly normal  Past Medical History:  Diagnosis Date   Hypertension     History reviewed. No pertinent surgical  history.  Current Outpatient Medications:    ferrous sulfate 325 (65 FE) MG EC tablet, Take 325 mg by mouth 3 (three) times daily with meals., Disp: , Rfl:    gabapentin (NEURONTIN) 100 MG capsule, Take 1 capsule (100 mg total) by mouth 3 (three) times daily. (Patient not taking: Reported on 09/13/2021), Disp: 90 capsule, Rfl: 3   losartan-hydrochlorothiazide (HYZAAR) 100-25 MG tablet, TAKE 1 TABLET BY MOUTH EVERY DAY (Patient not taking: Reported on 09/13/2021), Disp: 90 tablet, Rfl: 0   omeprazole (PRILOSEC) 20 MG capsule, Take 1 capsule (20 mg total) by mouth daily. (Patient not taking: Reported on 09/13/2021), Disp: 30 capsule, Rfl: 3   pregabalin (LYRICA) 50 MG capsule, Take 1 capsule (50 mg total) by mouth 3 (three) times daily. (Patient not taking: Reported on 09/13/2021), Disp: 90 capsule, Rfl: 2   RABEprazole (ACIPHEX) 20 MG tablet, Take 1 tablet (20 mg total) by mouth daily. (Patient not taking: No sig reported), Disp: 30 tablet, Rfl: 4   tadalafil (CIALIS) 20 MG tablet, Take 0.5-1 tablets (10-20 mg total) by mouth every other day as needed for erectile dysfunction. (Patient not taking: Reported on 09/13/2021), Disp: 5 tablet, Rfl: 11   tamsulosin (FLOMAX) 0.4 MG CAPS capsule, Take 1 capsule (0.4 mg total) by mouth daily. (Patient not taking: No sig reported), Disp: 30 capsule, Rfl: 3   triamcinolone cream (KENALOG) 0.5 %, Apply 1 application topically 3 (three) times daily. (Patient not taking: Reported on 09/13/2021), Disp: 30 g, Rfl: 0  History reviewed. No pertinent family history.   Social History   Tobacco Use   Smoking status: Never   Smokeless tobacco: Never  Substance Use Topics   Alcohol use: No   Drug use: No    Allergies as of 09/13/2021   (No Known Allergies)    Review of Systems:    All systems reviewed and negative except where noted in HPI.   Physical Exam:  BP 123/83 (BP Location: Left Arm, Patient Position: Sitting, Cuff Size: Normal)   Pulse 89   Temp  98.7 F (37.1 C) (Temporal)   Ht 5\' 10"  (1.778 m)   Wt 202 lb (91.6 kg)   BMI 28.98 kg/m  No LMP for male patient.  General:   Alert,  Well-developed, well-nourished, pleasant and cooperative in NAD Head:  Normocephalic and atraumatic. Eyes:  Sclera clear, no icterus.   Conjunctiva pink. Ears:  Normal auditory acuity. Nose:  No deformity, discharge, or lesions. Mouth:  No deformity or lesions,oropharynx pink & moist. Neck:  Supple; no masses or thyromegaly. Lungs:  Respirations even and unlabored.  Clear throughout to auscultation.   No wheezes, crackles, or rhonchi. No acute distress. Heart:  Regular rate and rhythm; no murmurs, clicks, rubs, or gallops. Abdomen:  Normal bowel sounds. Soft, non-tender and non-distended without masses, hepatosplenomegaly or hernias noted.  No guarding or rebound tenderness.   Rectal: Not performed Msk:  Symmetrical without gross deformities. Good, equal movement & strength bilaterally. Pulses:  Normal pulses noted. Extremities:  No clubbing or edema.  No cyanosis. Neurologic:  Alert and oriented x3;  grossly normal neurologically. Skin:  Intact without significant lesions or rashes. No jaundice. Psych:  Alert and cooperative. Normal mood and affect.  Imaging Studies: No abdominal imaging  Assessment and Plan:   Zachary Taylor is a 70 y.o. male with history of BPH, hypertension is seen in consultation for new diagnosis of iron deficiency anemia without evidence of active GI bleed.  I have discussed regarding the importance of endoscopic evaluation to evaluate the source of GI blood loss and to rule out any occult GI malignancy.  However, patient is refusing to undergo any procedures and he said that he would like to discuss with Dr. 73 and maybe consult 2 or 3 other doctors.  He said he will call my office if he decides to undergo procedures  Follow-up with hematology   Follow up as needed   Juel Burrow, MD

## 2021-09-18 ENCOUNTER — Telehealth (INDEPENDENT_AMBULATORY_CARE_PROVIDER_SITE_OTHER): Payer: Self-pay | Admitting: *Deleted

## 2021-09-18 NOTE — Telephone Encounter (Signed)
Per dr Laural Golden - schedule pt EDG and colonoscopy with mac for IDA next week. Dr Laural Golden states he has told pt to stop iron.

## 2021-09-19 ENCOUNTER — Other Ambulatory Visit (INDEPENDENT_AMBULATORY_CARE_PROVIDER_SITE_OTHER): Payer: Self-pay

## 2021-09-19 ENCOUNTER — Telehealth (INDEPENDENT_AMBULATORY_CARE_PROVIDER_SITE_OTHER): Payer: Self-pay

## 2021-09-19 ENCOUNTER — Encounter (INDEPENDENT_AMBULATORY_CARE_PROVIDER_SITE_OTHER): Payer: Self-pay

## 2021-09-19 DIAGNOSIS — D509 Iron deficiency anemia, unspecified: Secondary | ICD-10-CM

## 2021-09-19 MED ORDER — PEG 3350-KCL-NA BICARB-NACL 420 G PO SOLR: 4000.0000 mL | ORAL | 0 refills | Status: DC

## 2021-09-19 NOTE — Telephone Encounter (Signed)
Zachary Taylor, CMA  

## 2021-09-19 NOTE — Telephone Encounter (Signed)
Thank you :)

## 2021-09-19 NOTE — Telephone Encounter (Signed)
LeighAnn Kaid Seeberger, CMA  

## 2021-09-20 NOTE — Assessment & Plan Note (Signed)
Patient was advised to lose weight 

## 2021-09-20 NOTE — Assessment & Plan Note (Signed)

## 2021-09-20 NOTE — Assessment & Plan Note (Signed)
Intermittent problem 

## 2021-09-20 NOTE — Assessment & Plan Note (Signed)
-   The patient's GERD is stable on medication.  - Instructed the patient to avoid eating spicy and acidic foods, as well as foods high in fat. - Instructed the patient to avoid eating large meals or meals 2-3 hours prior to sleeping. 

## 2021-09-21 NOTE — Patient Instructions (Signed)
Zachary Taylor  09/21/2021     @PREFPERIOPPHARMACY @   Your procedure is scheduled on  09/26/2021.   Report to [facility] at  1250  P.M.   Call this number if you have problems the morning of surgery:  [phone]   Remember:  Follow the diet and prep instructions given to you by the office.    Take these medicines the morning of surgery with A SIP OF WATER                                     None     Do not wear jewelry, make-up or nail polish.  Do not wear lotions, powders, or perfumes, or deodorant.  Do not shave 48 hours prior to surgery.  Men may shave face and neck.  Do not bring valuables to the hospital.  [facility] is not responsible for any belongings or valuables.  Contacts, dentures or bridgework may not be worn into surgery.  Leave your suitcase in the car.  After surgery it may be brought to your room.  For patients admitted to the hospital, discharge time will be determined by your treatment team.  Patients discharged the day of surgery will not be allowed to drive home and must have someone with them for 24 hours.    Special instructions:   DO NOT smoke tobacco or vape for 24 hours before your procedure.  Please read over the following fact sheets that you were given. Anesthesia Post-op Instructions and Care and Recovery After Surgery      Upper Endoscopy, Adult, Care After This sheet gives you information about how to care for yourself after your procedure. Your health care provider may also give you more specific instructions. If you have problems or questions, contact your health care provider. What can I expect after the procedure? After the procedure, it is common to have: A sore throat. Mild stomach pain or discomfort. Bloating. Nausea. Follow these instructions at home:  Follow instructions from your health care provider about what to eat or drink after your procedure. Return to your normal activities as told by your health care  provider. Ask your health care provider what activities are safe for you. Take over-the-counter and prescription medicines only as told by your health care provider. If you were given a sedative during the procedure, it can affect you for several hours. Do not drive or operate machinery until your health care provider says that it is safe. Keep all follow-up visits as told by your health care provider. This is important. Contact a health care provider if you have: A sore throat that lasts longer than one day. Trouble swallowing. Get help right away if: You vomit blood or your vomit looks like coffee grounds. You have: A fever. Bloody, black, or tarry stools. A severe sore throat or you cannot swallow. Difficulty breathing. Severe pain in your chest or abdomen. Summary After the procedure, it is common to have a sore throat, mild stomach discomfort, bloating, and nausea. If you were given a sedative during the procedure, it can affect you for several hours. Do not drive or operate machinery until your health care provider says that it is safe. Follow instructions from your health care provider about what to eat or drink after your procedure. Return to your normal activities as told by your health care provider. This information  is not intended to replace advice given to you by your health care provider. Make sure you discuss any questions you have with your health care provider. Document Revised: 12/07/2019 Document Reviewed: 05/11/2018 Elsevier Patient Education  2022 [employer] Colonoscopy, Adult, Care After This sheet gives you information about how to care for yourself after your procedure. Your health care provider may also give you more specific instructions. If you have problems or questions, contact your health care provider. What can I expect after the procedure? After the procedure, it is common to have: A small amount of blood in your stool for 24 hours after the  procedure. Some gas. Mild cramping or bloating of your abdomen. Follow these instructions at home: Eating and drinking  Drink enough fluid to keep your urine pale yellow. Follow instructions from your health care provider about eating or drinking restrictions. Resume your normal diet as instructed by your health care provider. Avoid heavy or fried foods that are hard to digest. Activity Rest as told by your health care provider. Avoid sitting for a long time without moving. Get up to take short walks every 1-2 hours. This is important to improve blood flow and breathing. Ask for help if you feel weak or unsteady. Return to your normal activities as told by your health care provider. Ask your health care provider what activities are safe for you. Managing cramping and bloating  Try walking around when you have cramps or feel bloated. Apply heat to your abdomen as told by your health care provider. Use the heat source that your health care provider recommends, such as a moist heat pack or a heating pad. Place a towel between your skin and the heat source. Leave the heat on for 20-30 minutes. Remove the heat if your skin turns bright red. This is especially important if you are unable to feel pain, heat, or cold. You may have a greater risk of getting burned. General instructions If you were given a sedative during the procedure, it can affect you for several hours. Do not drive or operate machinery until your health care provider says that it is safe. For the first 24 hours after the procedure: Do not sign important documents. Do not drink alcohol. Do your regular daily activities at a slower pace than normal. Eat soft foods that are easy to digest. Take over-the-counter and prescription medicines only as told by your health care provider. Keep all follow-up visits as told by your health care provider. This is important. Contact a health care provider if: You have blood in your stool 2-3  days after the procedure. Get help right away if you have: More than a small spotting of blood in your stool. Large blood clots in your stool. Swelling of your abdomen. Nausea or vomiting. A fever. Increasing pain in your abdomen that is not relieved with medicine. Summary After the procedure, it is common to have a small amount of blood in your stool. You may also have mild cramping and bloating of your abdomen. If you were given a sedative during the procedure, it can affect you for several hours. Do not drive or operate machinery until your health care provider says that it is safe. Get help right away if you have a lot of blood in your stool, nausea or vomiting, a fever, or increased pain in your abdomen. This information is not intended to replace advice given to you by your health care provider. Make sure you discuss any questions you have  with your health care provider. Document Revised: 12/03/2019 Document Reviewed: 07/05/2019 Elsevier Patient Education  2022 Else[employer] Inc. Monitored Anesthesia Care, Care After This sheet gives you information about how to care for yourself after your procedure. Your health care provider may also give you more specific instructions. If you have problems or questions, contact your health care provider. What can I expect after the procedure? After the procedure, it is common to have: Tiredness. Forgetfulness about what happened after the procedure. Impaired judgment for important decisions. Nausea or vomiting. Some difficulty with balance. Follow these instructions at home: For the time period you were told by your health care provider:   Rest as needed. Do not participate in activities where you could fall or become injured. Do not drive or use machinery. Do not drink alcohol. Do not take sleeping pills or medicines that cause drowsiness. Do not make important decisions or sign legal documents. Do not take care of children on your own. Eating  and drinking Follow the diet that is recommended by your health care provider. Drink enough fluid to keep your urine pale yellow. If you vomit: Drink water, juice, or soup when you can drink without vomiting. Make sure you have little or no nausea before eating solid foods. General instructions Have a responsible adult stay with you for the time you are told. It is important to have someone help care for you until you are awake and alert. Take over-the-counter and prescription medicines only as told by your health care provider. If you have sleep apnea, surgery and certain medicines can increase your risk for breathing problems. Follow instructions from your health care provider about wearing your sleep device: Anytime you are sleeping, including during daytime naps. While taking prescription pain medicines, sleeping medicines, or medicines that make you drowsy. Avoid smoking. Keep all follow-up visits as told by your health care provider. This is important. Contact a health care provider if: You keep feeling nauseous or you keep vomiting. You feel light-headed. You are still sleepy or having trouble with balance after 24 hours. You develop a rash. You have a fever. You have redness or swelling around the IV site. Get help right away if: You have trouble breathing. You have new-onset confusion at home. Summary For several hours after your procedure, you may feel tired. You may also be forgetful and have poor judgment. Have a responsible adult stay with you for the time you are told. It is important to have someone help care for you until you are awake and alert. Rest as told. Do not drive or operate machinery. Do not drink alcohol or take sleeping pills. Get help right away if you have trouble breathing, or if you suddenly become confused. This information is not intended to replace advice given to you by your health care provider. Make sure you discuss any questions you have with your  health care provider. Document Revised: 08/24/2020 Document Reviewed: 11/11/2019 Elsevier Patient Education  2022 [employer].

## 2021-09-24 ENCOUNTER — Encounter (HOSPITAL_COMMUNITY)
Admission: RE | Admit: 2021-09-24 | Discharge: 2021-09-24 | Disposition: A | Discharge status: Home / Self Care | Payer: Medicare HMO | Source: Ambulatory Visit | Attending: Internal Medicine | Admitting: Internal Medicine

## 2021-09-24 DIAGNOSIS — Z01812 Encounter for preprocedural laboratory examination: Secondary | ICD-10-CM | POA: Diagnosis not present

## 2021-09-24 DIAGNOSIS — D509 Iron deficiency anemia, unspecified: Secondary | ICD-10-CM | POA: Insufficient documentation

## 2021-09-24 LAB — CBC WITH DIFFERENTIAL/PLATELET
Abs Immature Granulocytes: 0.01 10*3/uL (ref 0.00–0.07)
Basophils Absolute: 0 10*3/uL (ref 0.0–0.1)
Basophils Relative: 1 %
Eosinophils Absolute: 0.2 10*3/uL (ref 0.0–0.5)
Eosinophils Relative: 3 %
HCT: 43.1 % (ref 39.0–52.0)
Hemoglobin: 14 g/dL (ref 13.0–17.0)
Immature Granulocytes: 0 %
Lymphocytes Relative: 23 %
Lymphs Abs: 1.5 10*3/uL (ref 0.7–4.0)
MCH: 26.2 pg (ref 26.0–34.0)
MCHC: 32.5 g/dL (ref 30.0–36.0)
MCV: 80.7 fL (ref 80.0–100.0)
Monocytes Absolute: 0.6 10*3/uL (ref 0.1–1.0)
Monocytes Relative: 9 %
Neutro Abs: 4.1 10*3/uL (ref 1.7–7.7)
Neutrophils Relative %: 64 %
Platelets: 226 10*3/uL (ref 150–400)
RBC: 5.34 MIL/uL (ref 4.22–5.81)
RDW: 20.9 % — ABNORMAL HIGH (ref 11.5–15.5)
WBC: 6.4 10*3/uL (ref 4.0–10.5)
nRBC: 0 % (ref 0.0–0.2)

## 2021-09-24 LAB — BASIC METABOLIC PANEL
Anion gap: 9 (ref 5–15)
BUN: 16 mg/dL (ref 8–23)
CO2: 24 mmol/L (ref 22–32)
Calcium: 9.1 mg/dL (ref 8.9–10.3)
Chloride: 102 mmol/L (ref 98–111)
Creatinine, Ser: 1.02 mg/dL (ref 0.61–1.24)
GFR, Estimated: >60 mL/min (ref >60)
Glucose, Bld: 101 mg/dL — ABNORMAL HIGH (ref 70–99)
Potassium: 3.7 mmol/L (ref 3.5–5.1)
Sodium: 135 mmol/L (ref 135–145)

## 2021-09-26 ENCOUNTER — Encounter (HOSPITAL_COMMUNITY): Payer: Self-pay | Admitting: Internal Medicine

## 2021-09-26 ENCOUNTER — Encounter (HOSPITAL_COMMUNITY)
Admission: RE | Disposition: A | Discharge status: Home / Self Care | Payer: Self-pay | Source: Home / Self Care | Attending: Internal Medicine

## 2021-09-26 ENCOUNTER — Ambulatory Visit (HOSPITAL_COMMUNITY)
Admission: RE | Admit: 2021-09-26 | Discharge: 2021-09-26 | Disposition: A | Discharge status: Home / Self Care | Payer: Medicare HMO | Attending: Internal Medicine | Admitting: Internal Medicine

## 2021-09-26 ENCOUNTER — Ambulatory Visit (HOSPITAL_COMMUNITY): Payer: Medicare HMO | Admitting: Anesthesiology

## 2021-09-26 DIAGNOSIS — D509 Iron deficiency anemia, unspecified: Secondary | ICD-10-CM | POA: Diagnosis not present

## 2021-09-26 DIAGNOSIS — Z79899 Other long term (current) drug therapy: Secondary | ICD-10-CM | POA: Diagnosis not present

## 2021-09-26 DIAGNOSIS — K644 Residual hemorrhoidal skin tags: Secondary | ICD-10-CM | POA: Diagnosis not present

## 2021-09-26 DIAGNOSIS — K2970 Gastritis, unspecified, without bleeding: Secondary | ICD-10-CM | POA: Insufficient documentation

## 2021-09-26 DIAGNOSIS — K2289 Other specified disease of esophagus: Secondary | ICD-10-CM | POA: Diagnosis not present

## 2021-09-26 DIAGNOSIS — K2930 Chronic superficial gastritis without bleeding: Secondary | ICD-10-CM

## 2021-09-26 DIAGNOSIS — K449 Diaphragmatic hernia without obstruction or gangrene: Secondary | ICD-10-CM | POA: Insufficient documentation

## 2021-09-26 DIAGNOSIS — K2270 Barrett's esophagus without dysplasia: Secondary | ICD-10-CM | POA: Diagnosis not present

## 2021-09-26 DIAGNOSIS — D759 Disease of blood and blood-forming organs, unspecified: Secondary | ICD-10-CM | POA: Diagnosis not present

## 2021-09-26 HISTORY — PX: BIOPSY: SHX5522

## 2021-09-26 HISTORY — PX: COLONOSCOPY WITH PROPOFOL: SHX5780

## 2021-09-26 HISTORY — PX: ESOPHAGOGASTRODUODENOSCOPY (EGD) WITH PROPOFOL: SHX5813

## 2021-09-26 LAB — HM COLONOSCOPY

## 2021-09-26 SURGERY — COLONOSCOPY WITH PROPOFOL
Anesthesia: General

## 2021-09-26 MED ORDER — STERILE WATER FOR IRRIGATION IR SOLN
Status: DC | PRN
  Administered 2021-09-26: 300 mL

## 2021-09-26 MED ORDER — LACTATED RINGERS IV SOLN: INTRAVENOUS | Status: DC

## 2021-09-26 MED ORDER — OMEPRAZOLE 20 MG PO CPDR: 20.0000 mg | DELAYED_RELEASE_CAPSULE | Freq: Every day | ORAL | 3 refills | Status: DC

## 2021-09-26 MED ORDER — PROPOFOL 10 MG/ML IV BOLUS
INTRAVENOUS | Status: DC | PRN
  Administered 2021-09-26 (×3): 50 mg via INTRAVENOUS
  Administered 2021-09-26: 150 mg via INTRAVENOUS
  Administered 2021-09-26 (×2): 50 mg via INTRAVENOUS
  Administered 2021-09-26: 30 mg via INTRAVENOUS

## 2021-09-26 MED ORDER — LIDOCAINE HCL (CARDIAC) PF 50 MG/5ML IV SOSY
PREFILLED_SYRINGE | INTRAVENOUS | Status: DC | PRN
  Administered 2021-09-26: 50 mg via INTRAVENOUS

## 2021-09-26 NOTE — Anesthesia Procedure Notes (Signed)
Date/Time: 09/26/2021 2:00 PM Performed by: Franco Nones, CRNA Pre-anesthesia Checklist: Patient identified, Emergency Drugs available, Suction available, Timeout performed and Patient being monitored Patient Re-evaluated:Patient Re-evaluated prior to induction Oxygen Delivery Method: Nasal Cannula

## 2021-09-26 NOTE — Anesthesia Preprocedure Evaluation (Signed)
Anesthesia Evaluation  Patient identified by MRN, date of birth, ID band Patient awake    Reviewed: Allergy & Precautions, H&P , NPO status , Patient's Chart, lab work & pertinent test results, reviewed documented beta blocker date and time   Airway Mallampati: II  TM Distance: >3 FB Neck ROM: full    Dental no notable dental hx.    Pulmonary neg pulmonary ROS,    Pulmonary exam normal breath sounds clear to auscultation       Cardiovascular Exercise Tolerance: Good hypertension, negative cardio ROS   Rhythm:regular Rate:Normal     Neuro/Psych negative neurological ROS  negative psych ROS   GI/Hepatic negative GI ROS, Neg liver ROS,   Endo/Other  negative endocrine ROS  Renal/GU negative Renal ROS  negative genitourinary   Musculoskeletal   Abdominal   Peds  Hematology  (+) Blood dyscrasia, anemia ,   Anesthesia Other Findings   Reproductive/Obstetrics negative OB ROS                             Anesthesia Physical Anesthesia Plan  ASA: 2  Anesthesia Plan: General   Post-op Pain Management:    Induction:   PONV Risk Score and Plan: Propofol infusion  Airway Management Planned:   Additional Equipment:   Intra-op Plan:   Post-operative Plan:   Informed Consent: I have reviewed the patients History and Physical, chart, labs and discussed the procedure including the risks, benefits and alternatives for the proposed anesthesia with the patient or authorized representative who has indicated his/her understanding and acceptance.     Dental Advisory Given  Plan Discussed with: CRNA  Anesthesia Plan Comments:         Anesthesia Quick Evaluation  

## 2021-09-26 NOTE — Op Note (Signed)
[facility] Patient Name: Zachary Taylor Procedure Date: 09/26/2021 2:14 PM MRN: [id number] Date of Birth: 08-Jul-1951 Attending MD: Lionel December , MD CSN: [id number] Age: 71 Admit Type: Outpatient Procedure:                Colonoscopy Indications:              Iron deficiency anemia Providers:                Lionel December, MD, Sheran Fava, Edythe Clarity,                            Technician Referring MD:             Corky Downs, MD Medicines:                Propofol per Anesthesia Complications:            No immediate complications. Estimated Blood Loss:     Estimated blood loss: none. Procedure:                Pre-Anesthesia Assessment:                           - Prior to the procedure, a History and Physical                            was performed, and patient medications and                            allergies were reviewed. The patient's tolerance of                            previous anesthesia was also reviewed. The risks                            and benefits of the procedure and the sedation                            options and risks were discussed with the patient.                            All questions were answered, and informed consent                            was obtained. Prior Anticoagulants: The patient has                            taken no previous anticoagulant or antiplatelet                            agents. ASA Grade Assessment: II - A patient with                            mild systemic disease. After reviewing the risks  and benefits, the patient was deemed in                            satisfactory condition to undergo the procedure.                           After obtaining informed consent, the colonoscope                            was passed under direct vision. Throughout the                            procedure, the patient's blood pressure, pulse, and                            oxygen saturations were  monitored continuously. The                            [id number]-[id number] ([id number]) scope was introduced through                            the anus and advanced to the the cecum, identified                            by appendiceal orifice and ileocecal valve. The                            colonoscopy was performed without difficulty. The                            patient tolerated the procedure well. The quality                            of the bowel preparation was good. The ileocecal                            valve, appendiceal orifice, and rectum were                            photographed. Scope In: 2:18:11 PM Scope Out: 2:35:39 PM Scope Withdrawal Time: 0 hours 12 minutes 57 seconds  Total Procedure Duration: 0 hours 17 minutes 28 seconds  Findings:      The perianal and digital rectal examinations were normal.      The colon (entire examined portion) appeared normal.      External hemorrhoids were found during retroflexion. The hemorrhoids       were small. Impression:               - The entire examined colon is normal.                           - External hemorrhoids.                           - No specimens collected. Moderate Sedation:  Per Anesthesia Care Recommendation:           - Patient has a contact number available for                            emergencies. The signs and symptoms of potential                            delayed complications were discussed with the                            patient. Return to normal activities tomorrow.                            Written discharge instructions were provided to the                            patient.                           - Resume previous diet today.                           - Continue present medications.                           - Resume po iron as before.                           - Repeat colonoscopy in 10 years for screening                            purposes. Procedure Code(s):        ---  Professional ---                           [phone], Colonoscopy, flexible; diagnostic, including                            collection of specimen(s) by brushing or washing,                            when performed (separate procedure) Diagnosis Code(s):        --- Professional ---                           K64.4, Residual hemorrhoidal skin tags                           D50.9, Iron deficiency anemia, unspecified CPT copyright 2019 American Medical Association. All rights reserved. The codes documented in this report are preliminary and upon coder review may  be revised to meet current compliance requirements. Lionel December, MD Lionel December, MD 09/26/2021 2:47:17 PM This report has been signed electronically. Number of Addenda: 0

## 2021-09-26 NOTE — Transfer of Care (Signed)
Immediate Anesthesia Transfer of Care Note  Patient: Zachary Taylor  Procedure(s) Performed: COLONOSCOPY WITH PROPOFOL ESOPHAGOGASTRODUODENOSCOPY (EGD) WITH PROPOFOL BIOPSY  Patient Location: Short Stay  Anesthesia Type:General  Level of Consciousness: sedated  Airway & Oxygen Therapy: Patient Spontanous Breathing and Patient connected to nasal cannula oxygen  Post-op Assessment: Report given to RN and Post -op Vital signs reviewed and stable  Post vital signs: Reviewed and stable  Last Vitals:  Vitals Value Taken Time  BP    Temp    Pulse    Resp    SpO2     SEE VITAL SIGN FLOW SHEET Last Pain:  Vitals:   09/26/21 1402  TempSrc:   PainSc: 0-No pain      Patients Stated Pain Goal: 5 (09/26/21 1338)  Complications: No notable events documented.

## 2021-09-26 NOTE — Op Note (Signed)
[facility] Patient Name: Zachary Taylor Procedure Date: 09/26/2021 1:40 PM MRN: [id number] Date of Birth: May 16, 1951 Attending MD: Lionel December , MD CSN: [id number] Age: 70 Admit Type: Outpatient Procedure:                Upper GI endoscopy Indications:              Iron deficiency anemia Providers:                Lionel December, MD, Sheran Fava, Edythe Clarity,                            Technician Referring MD:             Corky Downs, MD Medicines:                Propofol per Anesthesia Complications:            No immediate complications. Estimated Blood Loss:     Estimated blood loss was minimal                           . Procedure:                Pre-Anesthesia Assessment:                           - Prior to the procedure, a History and Physical                            was performed, and patient medications and                            allergies were reviewed. The patient's tolerance of                            previous anesthesia was also reviewed. The risks                            and benefits of the procedure and the sedation                            options and risks were discussed with the patient.                            All questions were answered, and informed consent                            was obtained. Prior Anticoagulants: The patient has                            taken no previous anticoagulant or antiplatelet                            agents. ASA Grade Assessment: II - A patient with                            mild systemic disease. After  reviewing the risks                            and benefits, the patient was deemed in                            satisfactory condition to undergo the procedure.                           After obtaining informed consent, the endoscope was                            passed under direct vision. Throughout the                            procedure, the patient's blood pressure, pulse, and                             oxygen saturations were monitored continuously. The                            [id number]-H190 ([id number]) scope was introduced through the                            mouth, and advanced to the second part of duodenum.                            The upper GI endoscopy was accomplished without                            difficulty. The patient tolerated the procedure                            well. Scope In: 2:05:51 PM Scope Out: 2:14:04 PM Total Procedure Duration: 0 hours 8 minutes 13 seconds  Findings:      The hypopharynx was normal.      The examined esophagus was normal.      The Z-line was irregular and was found 35 cm from the incisors. Biopsies       were taken with a cold forceps for histology.      A 7 cm hiatal hernia was present.      Localized mild inflammation characterized by congestion (edema) and       erythema was found in the gastric fundus.      The exam of the stomach was otherwise normal.      The duodenal bulb and second portion of the duodenum were normal. Impression:               - Normal hypopharynx.                           - Normal esophagus.                           - Z-line irregular, 35 cm from the incisors.  Biopsied.                           - 7 cm hiatal hernia.                           - Gastritis.                           - Normal duodenal bulb and second portion of the                            duodenum. Moderate Sedation:      Per Anesthesia Care Recommendation:           - Patient has a contact number available for                            emergencies. The signs and symptoms of potential                            delayed complications were discussed with the                            patient. Return to normal activities tomorrow.                            Written discharge instructions were provided to the                            patient.                           - Resume previous diet today.                            - Continue present medications.                           - No aspirin, ibuprofen, naproxen, or other                            non-steroidal anti-inflammatory drugs for 1 day.                           - Await pathology results. Procedure Code(s):        --- Professional ---                           [phone], Esophagogastroduodenoscopy, flexible,                            transoral; with biopsy, single or multiple Diagnosis Code(s):        --- Professional ---                           K22.8, Other specified diseases of esophagus  K44.9, Diaphragmatic hernia without obstruction or                            gangrene                           K29.70, Gastritis, unspecified, without bleeding                           D50.9, Iron deficiency anemia, unspecified CPT copyright 2019 American Medical Association. All rights reserved. The codes documented in this report are preliminary and upon coder review may  be revised to meet current compliance requirements. Lionel December, MD Lionel December, MD 09/26/2021 2:43:55 PM This report has been signed electronically. Number of Addenda: 0

## 2021-09-26 NOTE — Discharge Instructions (Addendum)
No aspirin or NSAIDs for 24 hours. Take Prilosec/omeprazole 20 mg by mouth 30 minutes daily before breakfast. Resume scheduled medications including ferrous sulfate as before. Resume usual diet. No driving for 24 hours. Physician will call with biopsy results.

## 2021-09-26 NOTE — H&P (Signed)
Zachary Taylor is an 70 y.o. male.   Chief Complaint: Patient is here for esophagogastroduodenoscopy and colonoscopy. HPI: Patient is 70 year old male who was found to have iron deficiency anemia when he presented with fatigue and weakness.  His anemia is corrected with p.o. iron.  He has sporadic heartburn and needs certain foods.  He has been using PPI on a as needed basis but not daily.  He denies dysphagia nausea vomiting abdominal pain melena or rectal bleeding.  He does not take NSAIDs. Family history is negative for CRC.  Past Medical History:  Diagnosis Date   Hypertension     History reviewed. No pertinent surgical history.  History reviewed. No pertinent family history. Social History:  reports that he has never smoked. He has never used smokeless tobacco. He reports that he does not drink alcohol and does not use drugs.  Allergies: No Known Allergies  Medications Prior to Admission  Medication Sig Dispense Refill   Cholecalciferol (VITAMIN D3 PO) Take 1 tablet by mouth daily.     ferrous sulfate 325 (65 FE) MG EC tablet Take 325 mg by mouth daily with breakfast.     gabapentin (NEURONTIN) 100 MG capsule Take 1 capsule (100 mg total) by mouth 3 (three) times daily. 90 capsule 3   losartan-hydrochlorothiazide (HYZAAR) 100-25 MG tablet TAKE 1 TABLET BY MOUTH EVERY DAY 90 tablet 0   Multiple Vitamin (MULTIVITAMIN WITH MINERALS) TABS tablet Take 1 tablet by mouth daily.     omeprazole (PRILOSEC) 20 MG capsule Take 1 capsule (20 mg total) by mouth daily. 30 capsule 3   polyethylene glycol-electrolytes (TRILYTE) 420 g solution Take 4,000 mLs by mouth as directed. 4000 mL 0   pregabalin (LYRICA) 50 MG capsule Take 1 capsule (50 mg total) by mouth 3 (three) times daily. (Patient taking differently: Take 50 mg by mouth in the morning.) 90 capsule 2   RABEprazole (ACIPHEX) 20 MG tablet Take 1 tablet (20 mg total) by mouth daily. 30 tablet 4   tadalafil (CIALIS) 20 MG tablet Take 0.5-1 tablets  (10-20 mg total) by mouth every other day as needed for erectile dysfunction. (Patient not taking: No sig reported) 5 tablet 11   tamsulosin (FLOMAX) 0.4 MG CAPS capsule Take 1 capsule (0.4 mg total) by mouth daily. (Patient not taking: No sig reported) 30 capsule 3   triamcinolone cream (KENALOG) 0.5 % Apply 1 application topically 3 (three) times daily. (Patient not taking: No sig reported) 30 g 0    Results for orders placed or performed during the hospital encounter of 09/24/21 (from the past 48 hour(s))  CBC with Differential/Platelet     Status: Abnormal   Collection Time: 09/24/21  3:31 PM  Result Value Ref Range   WBC 6.4 4.0 - 10.5 K/uL   RBC 5.34 4.22 - 5.81 MIL/uL   Hemoglobin 14.0 13.0 - 17.0 g/dL   HCT 42.5 95.6 - 38.7 %   MCV 80.7 80.0 - 100.0 fL   MCH 26.2 26.0 - 34.0 pg   MCHC 32.5 30.0 - 36.0 g/dL   RDW 56.4 (H) 33.2 - 95.1 %   Platelets 226 150 - 400 K/uL   nRBC 0.0 0.0 - 0.2 %   Neutrophils Relative % 64 %   Neutro Abs 4.1 1.7 - 7.7 K/uL   Lymphocytes Relative 23 %   Lymphs Abs 1.5 0.7 - 4.0 K/uL   Monocytes Relative 9 %   Monocytes Absolute 0.6 0.1 - 1.0 K/uL   Eosinophils Relative 3 %  Eosinophils Absolute 0.2 0.0 - 0.5 K/uL   Basophils Relative 1 %   Basophils Absolute 0.0 0.0 - 0.1 K/uL   Immature Granulocytes 0 %   Abs Immature Granulocytes 0.01 0.00 - 0.07 K/uL    Comment: Performed at [facility], [address], [place], [place] [address]  Basic metabolic panel     Status: Abnormal   Collection Time: 09/24/21  3:31 PM  Result Value Ref Range   Sodium 135 135 - 145 mmol/L   Potassium 3.7 3.5 - 5.1 mmol/L   Chloride 102 98 - 111 mmol/L   CO2 24 22 - 32 mmol/L   Glucose, Bld 101 (H) 70 - 99 mg/dL    Comment: Glucose reference range applies only to samples taken after fasting for at least 8 hours.   BUN 16 8 - 23 mg/dL   Creatinine, Ser 5.42 0.61 - 1.24 mg/dL   Calcium 9.1 8.9 - 70.6 mg/dL   GFR, Estimated >23 >76 mL/min    Comment:  (NOTE) Calculated using the CKD-EPI Creatinine Equation (2021)    Anion gap 9 5 - 15    Comment: Performed at [facility], [address], [place], [place] [address]   No results found.  Review of Systems  Blood pressure (!) 137/92, pulse 88, temperature 98.4 F (36.9 C), temperature source Oral, resp. rate 18, SpO2 98 %. Physical Exam HENT:     Mouth/Throat:     Mouth: Mucous membranes are moist.     Pharynx: Oropharynx is clear.  Eyes:     General: No scleral icterus.    Conjunctiva/sclera: Conjunctivae normal.  Cardiovascular:     Rate and Rhythm: Normal rate and regular rhythm.     Heart sounds: Normal heart sounds. No murmur heard. Pulmonary:     Effort: Pulmonary effort is normal.     Breath sounds: Normal breath sounds.  Abdominal:     General: There is no distension.     Palpations: Abdomen is soft. There is no mass.     Tenderness: There is no abdominal tenderness.  Musculoskeletal:        General: No swelling.     Cervical back: Neck supple.  Lymphadenopathy:     Cervical: No cervical adenopathy.  Skin:    General: Skin is warm and dry.  Neurological:     Mental Status: He is alert.     Assessment/Plan  Iron deficiency anemia. Diagnostic esophagogastroduodenoscopy and colonoscopy.  Lionel December, MD 09/26/2021, 1:55 PM

## 2021-09-26 NOTE — Anesthesia Postprocedure Evaluation (Signed)
Anesthesia Post Note  Patient: [name]  Procedure(s) Performed: COLONOSCOPY WITH PROPOFOL ESOPHAGOGASTRODUODENOSCOPY (EGD) WITH PROPOFOL BIOPSY  Patient location during evaluation: Phase II Anesthesia Type: General Level of consciousness: awake Pain management: pain level controlled Vital Signs Assessment: post-procedure vital signs reviewed and stable Respiratory status: spontaneous breathing and respiratory function stable Cardiovascular status: blood pressure returned to baseline and stable Postop Assessment: no headache and no apparent nausea or vomiting Anesthetic complications: no Comments: Late entry   No notable events documented.   Last Vitals:  Vitals:   09/26/21 1442 09/26/21 1447  BP:  96/69  Pulse: 64   Resp: 18   Temp: 36.5 C   SpO2: 100%     Last Pain:  Vitals:   09/26/21 1442  TempSrc: Oral  PainSc: 0-No pain                 Windell Norfolk

## 2021-09-28 LAB — SURGICAL PATHOLOGY

## 2021-09-29 IMAGING — CR DG CHEST 2V
1 series · 2 of 2 positions shown · non-contrast
Comparison: None.

CLINICAL DATA: 70-year-old male with a history of annual checkup,
shortness of breath

EXAM:
CHEST - 2 VIEW

[Series 1: dg chest 2 view · 0.14mm/px · 2 of 2 slices shown]
[im 1/2]
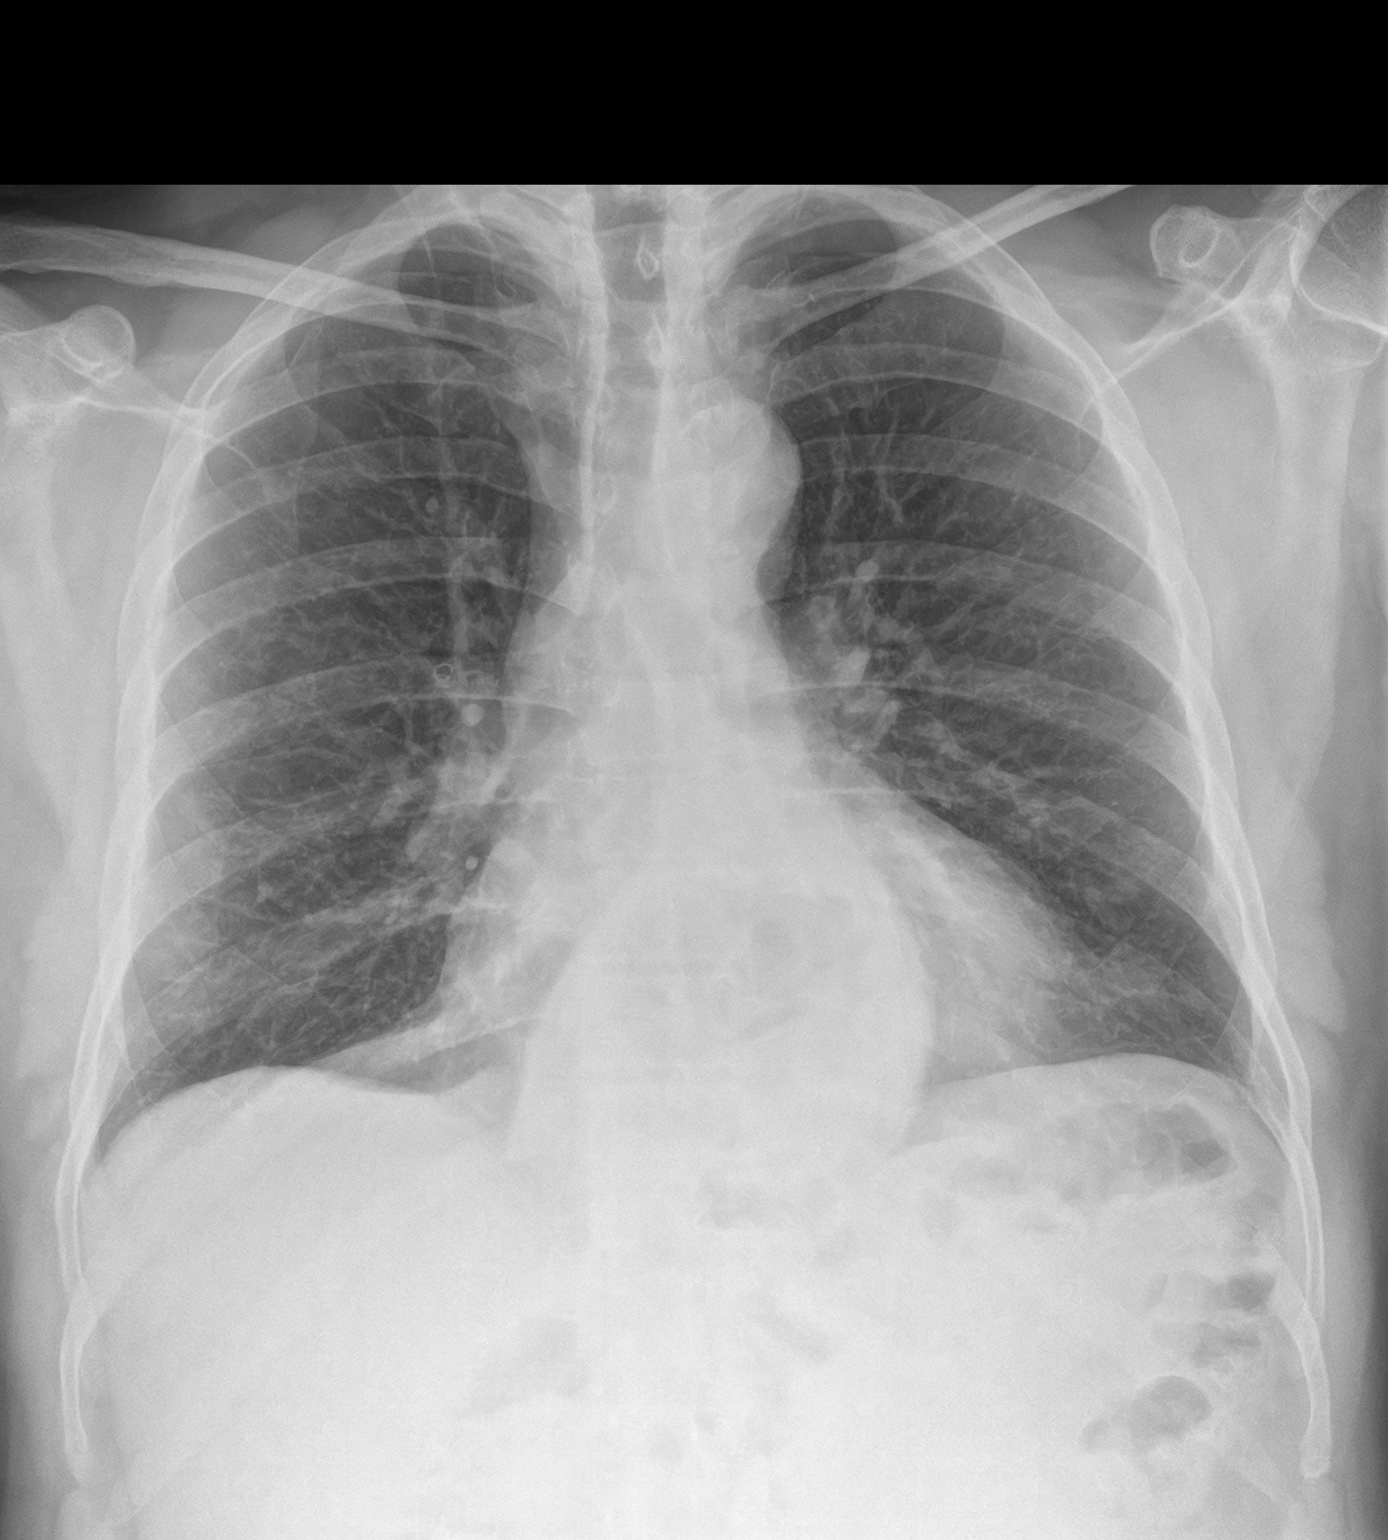
[im 2/2]
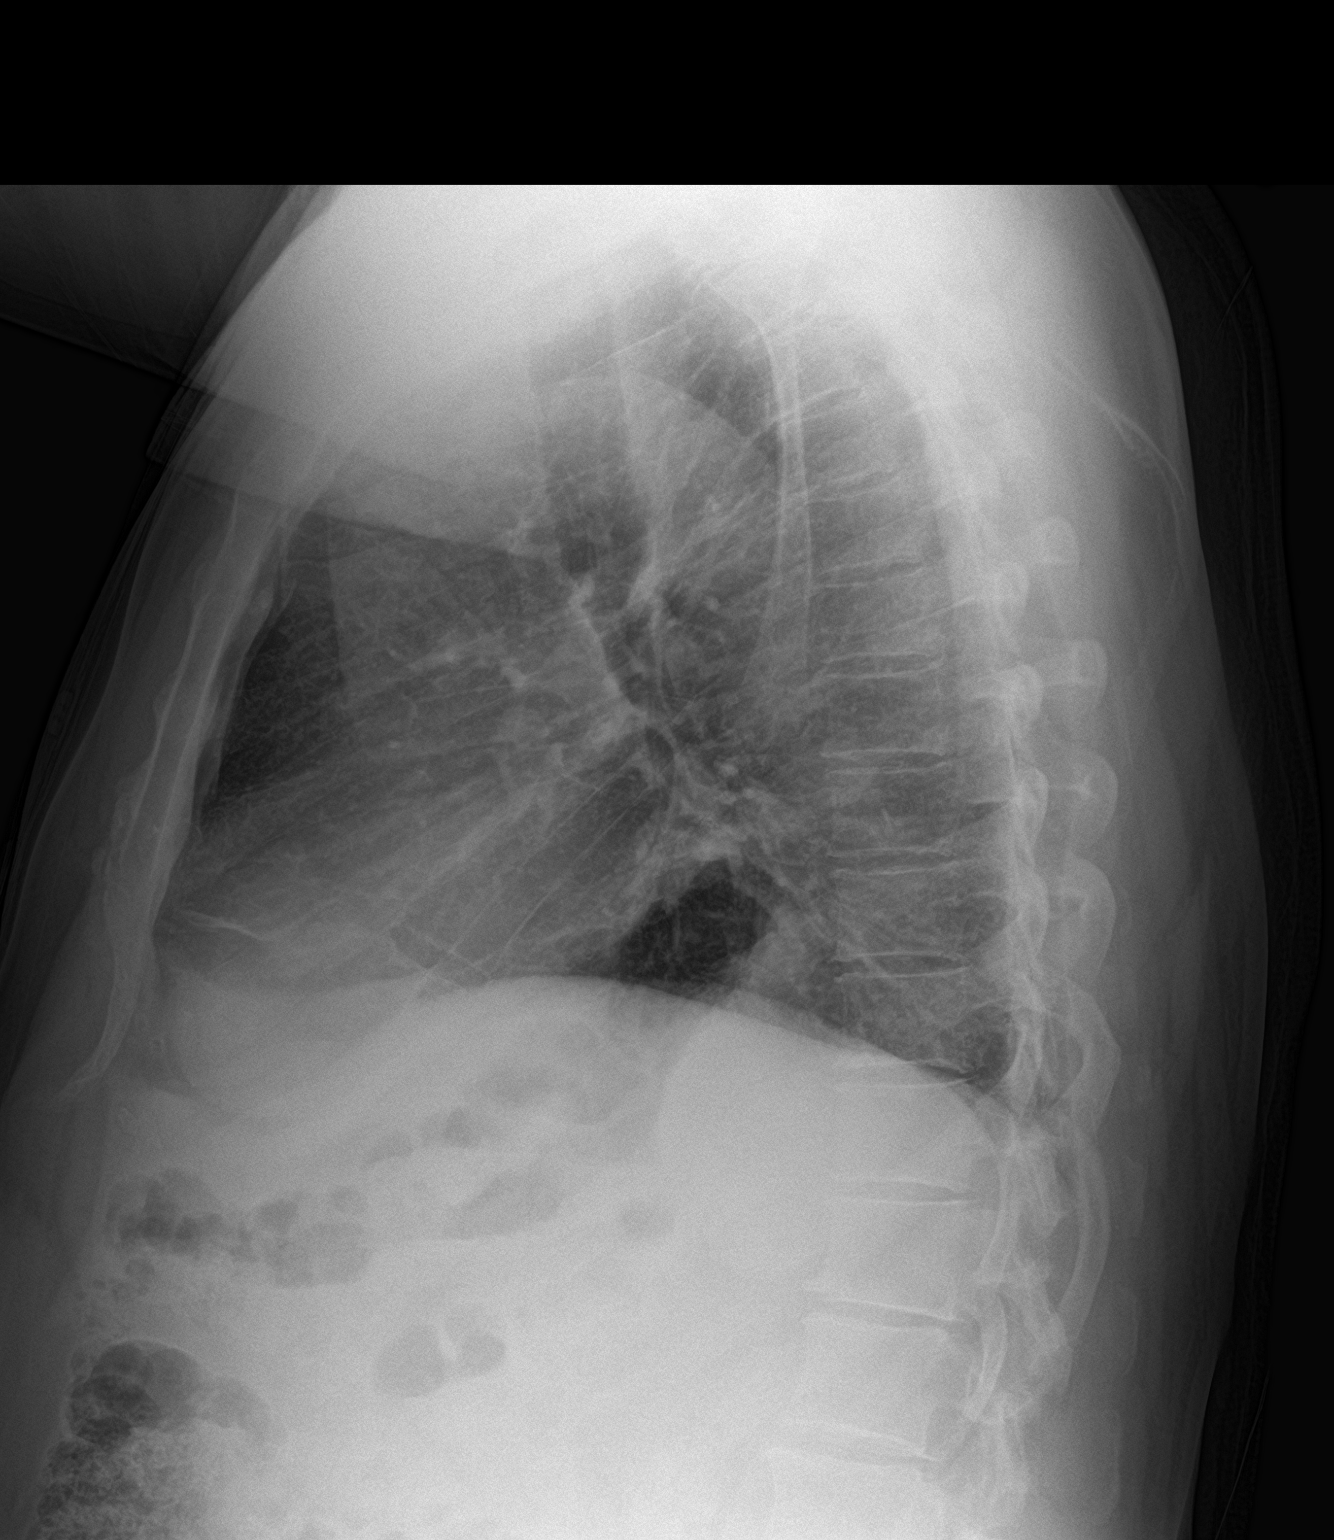

[2 of 2 positions shown; findings below may reference images not displayed]

FINDINGS: Cardiomediastinal silhouette within normal limits in size and
contour. No evidence of central vascular congestion. No interlobular
septal thickening.

Double density over the lower mediastinum.

Minimal linear opacities at the lung bases with no confluent
airspace disease.

No pneumothorax or pleural effusion.

No acute displaced fracture. Degenerative changes of the spine.
IMPRESSION: Basilar atelectasis/scarring with no definite evidence of acute
cardiopulmonary disease.

Hiatal hernia

## 2021-10-02 ENCOUNTER — Encounter (HOSPITAL_COMMUNITY): Payer: Self-pay | Admitting: Internal Medicine

## 2021-10-03 ENCOUNTER — Encounter (INDEPENDENT_AMBULATORY_CARE_PROVIDER_SITE_OTHER): Payer: Self-pay | Admitting: *Deleted

## 2021-10-15 ENCOUNTER — Other Ambulatory Visit: Payer: Medicare HMO

## 2021-10-16 ENCOUNTER — Ambulatory Visit: Payer: Medicare HMO

## 2021-10-16 ENCOUNTER — Ambulatory Visit: Payer: Medicare HMO | Admitting: Oncology

## 2021-10-16 ENCOUNTER — Other Ambulatory Visit: Payer: Medicare HMO

## 2021-10-17 DIAGNOSIS — M25562 Pain in left knee: Secondary | ICD-10-CM | POA: Diagnosis not present

## 2021-10-17 DIAGNOSIS — M629 Disorder of muscle, unspecified: Secondary | ICD-10-CM | POA: Diagnosis not present

## 2021-10-17 DIAGNOSIS — M25561 Pain in right knee: Secondary | ICD-10-CM | POA: Diagnosis not present

## 2021-10-30 ENCOUNTER — Other Ambulatory Visit: Payer: Self-pay | Admitting: *Deleted

## 2021-10-30 DIAGNOSIS — I10 Essential (primary) hypertension: Secondary | ICD-10-CM

## 2021-10-30 MED ORDER — LOSARTAN POTASSIUM-HCTZ 100-25 MG PO TABS: 1.0000 | ORAL_TABLET | Freq: Every day | ORAL | 3 refills | Status: DC

## 2021-11-19 ENCOUNTER — Other Ambulatory Visit: Payer: Self-pay | Admitting: *Deleted

## 2021-11-19 ENCOUNTER — Other Ambulatory Visit: Payer: Self-pay | Admitting: Internal Medicine

## 2021-11-19 DIAGNOSIS — N529 Male erectile dysfunction, unspecified: Secondary | ICD-10-CM

## 2021-11-19 MED ORDER — TRIAMCINOLONE ACETONIDE 0.5 % EX CREA: 1.0000 "application " | TOPICAL_CREAM | Freq: Three times a day (TID) | CUTANEOUS | 0 refills | Status: DC

## 2021-11-23 DIAGNOSIS — H524 Presbyopia: Secondary | ICD-10-CM | POA: Diagnosis not present

## 2021-11-23 DIAGNOSIS — Z0100 Encounter for examination of eyes and vision without abnormal findings: Secondary | ICD-10-CM | POA: Diagnosis not present

## 2021-12-03 ENCOUNTER — Other Ambulatory Visit: Payer: Medicare HMO

## 2021-12-03 DIAGNOSIS — D649 Anemia, unspecified: Secondary | ICD-10-CM

## 2021-12-03 LAB — CBC WITH DIFFERENTIAL/PLATELET
Absolute Monocytes: 668 cells/uL (ref 200–950)
Basophils Absolute: 50 cells/uL (ref 0–200)
Basophils Relative: 0.8 %
Eosinophils Absolute: 239 cells/uL (ref 15–500)
Eosinophils Relative: 3.8 %
HCT: 45.2 % (ref 38.5–50.0)
Hemoglobin: 15.1 g/dL (ref 13.2–17.1)
Lymphs Abs: 1531 cells/uL (ref 850–3900)
MCH: 28 pg (ref 27.0–33.0)
MCHC: 33.4 g/dL (ref 32.0–36.0)
MCV: 83.7 fL (ref 80.0–100.0)
MPV: 12.6 fL — ABNORMAL HIGH (ref 7.5–12.5)
Monocytes Relative: 10.6 %
Neutro Abs: 3812 cells/uL (ref 1500–7800)
Neutrophils Relative %: 60.5 %
Platelets: 214 10*3/uL (ref 140–400)
RBC: 5.4 10*6/uL (ref 4.20–5.80)
RDW: 13.9 % (ref 11.0–15.0)
Total Lymphocyte: 24.3 %
WBC: 6.3 10*3/uL (ref 3.8–10.8)

## 2021-12-03 LAB — IRON: Iron: 117 ug/dL (ref 50–180)

## 2021-12-06 LAB — FERRITIN, SERUM (SERIAL): Ferritin: 84 ng/mL (ref 30–400)

## 2021-12-18 DIAGNOSIS — J101 Influenza due to other identified influenza virus with other respiratory manifestations: Secondary | ICD-10-CM | POA: Diagnosis not present

## 2021-12-18 DIAGNOSIS — R059 Cough, unspecified: Secondary | ICD-10-CM | POA: Diagnosis not present

## 2022-01-08 ENCOUNTER — Other Ambulatory Visit: Payer: Self-pay | Admitting: *Deleted

## 2022-01-08 MED ORDER — TRIAMCINOLONE ACETONIDE 0.5 % EX CREA: 1.0000 "application " | TOPICAL_CREAM | Freq: Three times a day (TID) | CUTANEOUS | 3 refills | Status: DC

## 2022-01-15 ENCOUNTER — Other Ambulatory Visit: Payer: Self-pay | Admitting: *Deleted

## 2022-01-15 DIAGNOSIS — N529 Male erectile dysfunction, unspecified: Secondary | ICD-10-CM

## 2022-01-15 MED ORDER — TADALAFIL 20 MG PO TABS: ORAL_TABLET | ORAL | 3 refills | Status: DC

## 2022-01-19 ENCOUNTER — Other Ambulatory Visit: Payer: Self-pay | Admitting: Internal Medicine

## 2022-01-19 DIAGNOSIS — N529 Male erectile dysfunction, unspecified: Secondary | ICD-10-CM

## 2022-02-21 ENCOUNTER — Other Ambulatory Visit (INDEPENDENT_AMBULATORY_CARE_PROVIDER_SITE_OTHER): Payer: Self-pay | Admitting: Internal Medicine

## 2022-02-21 DIAGNOSIS — K2930 Chronic superficial gastritis without bleeding: Secondary | ICD-10-CM

## 2022-02-21 MED ORDER — OMEPRAZOLE 20 MG PO CPDR: 20.0000 mg | DELAYED_RELEASE_CAPSULE | Freq: Every day | ORAL | 3 refills | Status: DC

## 2022-03-05 ENCOUNTER — Encounter: Payer: Self-pay | Admitting: Internal Medicine

## 2022-03-05 ENCOUNTER — Other Ambulatory Visit: Payer: Self-pay

## 2022-03-05 ENCOUNTER — Ambulatory Visit (INDEPENDENT_AMBULATORY_CARE_PROVIDER_SITE_OTHER): Payer: Medicare HMO | Admitting: Internal Medicine

## 2022-03-05 VITALS — BP 130/84 | HR 68 | Ht 70.0 in | Wt 205.1 lb

## 2022-03-05 DIAGNOSIS — E8881 Metabolic syndrome: Secondary | ICD-10-CM

## 2022-03-05 DIAGNOSIS — D649 Anemia, unspecified: Secondary | ICD-10-CM | POA: Diagnosis not present

## 2022-03-05 DIAGNOSIS — G629 Polyneuropathy, unspecified: Secondary | ICD-10-CM

## 2022-03-05 DIAGNOSIS — D500 Iron deficiency anemia secondary to blood loss (chronic): Secondary | ICD-10-CM | POA: Diagnosis not present

## 2022-03-05 DIAGNOSIS — I10 Essential (primary) hypertension: Secondary | ICD-10-CM

## 2022-03-05 NOTE — Progress Notes (Signed)
Established Patient Office Visit  Subjective:  Patient ID: Zachary Taylor, male    DOB: May 08, 1951  Age: 71 y.o. MRN: [id number]  CC:  Chief Complaint  Patient presents with   Anemia    Anemia   Zachary Taylor presents for anemia  Past Medical History:  Diagnosis Date   Hypertension     Past Surgical History:  Procedure Laterality Date   BIOPSY  09/26/2021   Procedure: BIOPSY;  Surgeon: Malissa Hippo, MD;  [facility]: [facility];  Service: Endoscopy;;   COLONOSCOPY WITH PROPOFOL N/A 09/26/2021   Procedure: COLONOSCOPY WITH PROPOFOL;  Surgeon: Malissa Hippo, MD;  Location: [facility];  Service: Endoscopy;  Laterality: N/A;  2:20   ESOPHAGOGASTRODUODENOSCOPY (EGD) WITH PROPOFOL N/A 09/26/2021   Procedure: ESOPHAGOGASTRODUODENOSCOPY (EGD) WITH PROPOFOL;  Surgeon: Malissa Hippo, MD;  Location: [facility];  Service: Endoscopy;  Laterality: N/A;    History reviewed. No pertinent family history.  Social History   Socioeconomic History   Marital status: Married    Spouse name: Not on file   Number of children: Not on file   Years of education: Not on file   Highest education level: Not on file  Occupational History   Not on file  Tobacco Use   Smoking status: Never   Smokeless tobacco: Never  Substance and Sexual Activity   Alcohol use: No   Drug use: No   Sexual activity: Not on file  Other Topics Concern   Not on file  Social History Narrative   Not on file   Social Determinants of Health   Financial Resource Strain: Not on file  Food Insecurity: Not on file  Transportation Needs: Not on file  Physical Activity: Not on file  Stress: Not on file  Social Connections: Not on file  Intimate Partner Violence: Not on file     Current Outpatient Medications:    Cholecalciferol (VITAMIN D3 PO), Take 1 tablet by mouth daily., Disp: , Rfl:    ferrous sulfate 325 (65 FE) MG EC tablet, Take 325 mg by mouth daily with breakfast., Disp: , Rfl:     losartan-hydrochlorothiazide (HYZAAR) 100-25 MG tablet, Take 1 tablet by mouth daily., Disp: 90 tablet, Rfl: 3   Multiple Vitamin (MULTIVITAMIN WITH MINERALS) TABS tablet, Take 1 tablet by mouth daily., Disp: , Rfl:    omeprazole (PRILOSEC) 20 MG capsule, Take 1 capsule (20 mg total) by mouth daily before breakfast., Disp: 90 capsule, Rfl: 3   pregabalin (LYRICA) 50 MG capsule, Take 1 capsule (50 mg total) by mouth 3 (three) times daily. (Patient taking differently: Take 50 mg by mouth in the morning.), Disp: 90 capsule, Rfl: 2   tadalafil (CIALIS) 20 MG tablet, TAKE 1/2 TO 1 (ONE-HALF TO ONE) TABLET BY MOUTH EVERY OTHER DAY AS NEEDED FOR ERECTILE DYSFUNCTION, Disp: 5 tablet, Rfl: 0   triamcinolone cream (KENALOG) 0.5 %, Apply 1 application topically 3 (three) times daily., Disp: 30 g, Rfl: 3   No Known Allergies  ROS Review of Systems  Constitutional: Negative.   HENT: Negative.    Eyes: Negative.   Respiratory: Negative.    Cardiovascular: Negative.   Gastrointestinal: Negative.   Endocrine: Negative.   Genitourinary: Negative.   Musculoskeletal: Negative.   Skin: Negative.   Allergic/Immunologic: Negative.   Neurological: Negative.   Hematological: Negative.   Psychiatric/Behavioral: Negative.    All other systems reviewed and are negative.    Objective:    Physical Exam Vitals reviewed.  Constitutional:      Appearance: Normal appearance.  HENT:     Mouth/Throat:     Mouth: Mucous membranes are moist.  Eyes:     Pupils: Pupils are equal, round, and reactive to light.  Neck:     Vascular: No carotid bruit.  Cardiovascular:     Rate and Rhythm: Normal rate and regular rhythm.     Pulses: Normal pulses.     Heart sounds: Normal heart sounds.  Pulmonary:     Effort: Pulmonary effort is normal.     Breath sounds: Normal breath sounds.  Abdominal:     General: Bowel sounds are normal.     Palpations: Abdomen is soft. There is no hepatomegaly, splenomegaly or mass.      Tenderness: There is no abdominal tenderness.     Hernia: No hernia is present.  Musculoskeletal:     Cervical back: Neck supple.     Right lower leg: No edema.     Left lower leg: No edema.  Skin:    Findings: No rash.  Neurological:     Mental Status: He is alert and oriented to person, place, and time.     Motor: No weakness.  Psychiatric:        Mood and Affect: Mood normal.        Behavior: Behavior normal.  Prostate is normal, patient told to bring stool to recheck her blood  BP 130/84   Pulse 68   Ht 5\' 10"  (1.778 m)   Wt 205 lb 1.6 oz (93 kg)   BMI 29.43 kg/m  Wt Readings from Last 3 Encounters:  03/05/22 205 lb 1.6 oz (93 kg)  09/24/21 200 lb (90.7 kg)  09/13/21 202 lb (91.6 kg)     Health Maintenance Due  Topic Date Due   Hepatitis C Screening  Never done   TETANUS/TDAP  Never done   Zoster Vaccines- Shingrix (1 of 2) Never done   Pneumonia Vaccine 72+ Years old (1 - PCV) Never done   INFLUENZA VACCINE  Never done   COVID-19 Vaccine (5 - Booster for Pfizer series) 08/28/2021    There are no preventive care reminders to display for this patient.  Lab Results  Component Value Date   TSH 2.00 07/09/2021   Lab Results  Component Value Date   WBC 6.3 12/03/2021   HGB 15.1 12/03/2021   HCT 45.2 12/03/2021   MCV 83.7 12/03/2021   PLT 214 12/03/2021   Lab Results  Component Value Date   NA 135 09/24/2021   K 3.7 09/24/2021   CO2 24 09/24/2021   GLUCOSE 101 (H) 09/24/2021   BUN 16 09/24/2021   CREATININE 1.02 09/24/2021   BILITOT 0.4 07/19/2021   ALKPHOS 59 07/19/2021   AST 21 07/19/2021   ALT 18 07/19/2021   PROT 7.5 07/19/2021   ALBUMIN 4.1 07/19/2021   CALCIUM 9.1 09/24/2021   ANIONGAP 9 09/24/2021   EGFR 72 07/09/2021   Lab Results  Component Value Date   CHOL 150 07/09/2021   Lab Results  Component Value Date   HDL 36 (L) 07/09/2021   Lab Results  Component Value Date   LDLCALC 92 07/09/2021   Lab Results  Component Value Date    TRIG 120 07/09/2021   Lab Results  Component Value Date   CHOLHDL 4.2 07/09/2021   No results found for: HGBA1C    Assessment & Plan:   Problem List Items Addressed This Visit  Cardiovascular and Mediastinum   Essential hypertension     Patient denies any chest pain or shortness of breath there is no history of palpitation or paroxysmal nocturnal dyspnea   patient was advised to follow low-salt low-cholesterol diet    ideally I want to keep systolic blood pressure below 409 mmHg, patient was asked to check blood pressure one times a week and give me a report on that.  Patient will be follow-up in 3 months  or earlier as needed, patient will call me back for any change in the cardiovascular symptoms Patient was advised to buy a book from local bookstore concerning blood pressure and read several chapters  every day.  This will be supplemented by some of the material we will give him from the office.  Patient should also utilize other resources like YouTube and Internet to learn more about the blood pressure and the diet.        Nervous and Auditory   Neuropathy    Stable at the present time        Other   Metabolic syndrome    - I encouraged the patient to lose weight.  - I educated them on making healthy dietary choices including eating more fruits and vegetables and less fried foods. - I encouraged the patient to exercise more, and educated on the benefits of exercise including weight loss, diabetes prevention, and hypertension prevention.   Dietary counseling with a registered dietician  Referral to a weight management support group (e.g. Weight Watchers, Overeaters Anonymous)  If your BMI is greater than 29 or you have gained more than 15 pounds you should work on weight loss.  Attend a healthy cooking class       Anemia - Primary    We will check serum ferritin level.  Prostate is normal stool negative for blood      Relevant Orders   CBC with  Differential/Platelet   Antinuclear Antib (ANA)   Ferritin   Iron, TIBC and Ferritin Panel   Iron deficiency anemia due to chronic blood loss      we will check stool for blood, rectal examination is normal, will also check serum ferritin level, colonoscopy has been done and is unremarkable       No orders of the defined types were placed in this encounter.   Follow-up: No follow-ups on file.    Corky Downs, MD

## 2022-03-05 NOTE — Assessment & Plan Note (Signed)
we will check stool for blood, rectal examination is normal, will also check serum ferritin level, colonoscopy has been done and is unremarkable ?

## 2022-03-05 NOTE — Assessment & Plan Note (Signed)

## 2022-03-05 NOTE — Assessment & Plan Note (Signed)

## 2022-03-05 NOTE — Assessment & Plan Note (Signed)
We will check serum ferritin level.  Prostate is normal stool negative for blood ?

## 2022-03-05 NOTE — Assessment & Plan Note (Signed)
Stable at the present time. 

## 2022-03-07 LAB — ANA: Anti Nuclear Antibody (ANA): POSITIVE — AB

## 2022-03-07 LAB — CBC WITH DIFFERENTIAL/PLATELET
Absolute Monocytes: 525 cells/uL (ref 200–950)
Basophils Absolute: 52 cells/uL (ref 0–200)
Basophils Relative: 1 %
Eosinophils Absolute: 270 cells/uL (ref 15–500)
Eosinophils Relative: 5.2 %
HCT: 48.5 % (ref 38.5–50.0)
Hemoglobin: 15.7 g/dL (ref 13.2–17.1)
Lymphs Abs: 1503 cells/uL (ref 850–3900)
MCH: 27.4 pg (ref 27.0–33.0)
MCHC: 32.4 g/dL (ref 32.0–36.0)
MCV: 84.8 fL (ref 80.0–100.0)
MPV: 11.6 fL (ref 7.5–12.5)
Monocytes Relative: 10.1 %
Neutro Abs: 2850 cells/uL (ref 1500–7800)
Neutrophils Relative %: 54.8 %
Platelets: 220 10*3/uL (ref 140–400)
RBC: 5.72 10*6/uL (ref 4.20–5.80)
RDW: 12.7 % (ref 11.0–15.0)
Total Lymphocyte: 28.9 %
WBC: 5.2 10*3/uL (ref 3.8–10.8)

## 2022-03-07 LAB — IRON,TIBC AND FERRITIN PANEL
%SAT: 15 % (calc) — ABNORMAL LOW (ref 20–48)
Ferritin: 11 ng/mL — ABNORMAL LOW (ref 24–380)
Iron: 69 ug/dL (ref 50–180)
TIBC: 474 mcg/dL (calc) — ABNORMAL HIGH (ref 250–425)

## 2022-03-07 LAB — ANTI-NUCLEAR AB-TITER (ANA TITER): ANA Titer 1: 1:160 {titer} — ABNORMAL HIGH

## 2022-03-11 NOTE — Addendum Note (Signed)
Addended by: Alois Cliche on: 03/11/2022 04:12 PM ? ? Modules accepted: Orders ? ?

## 2022-03-22 LAB — STOOL CULTURE

## 2022-03-22 LAB — SPECIMEN STATUS REPORT

## 2022-04-02 ENCOUNTER — Telehealth (INDEPENDENT_AMBULATORY_CARE_PROVIDER_SITE_OTHER): Payer: Self-pay | Admitting: Internal Medicine

## 2022-04-02 NOTE — Telephone Encounter (Signed)
Lab studies from 03/05/2022 reviewed.  These were performed by Dr. Corky Downs, patient's primary care physician.  H&H is normal but iron saturation has dropped to 15 and ferritin is 11. ?We will proceed with small bowel given capsule study later this month. ?Patient is aware. ?

## 2022-04-09 ENCOUNTER — Encounter (INDEPENDENT_AMBULATORY_CARE_PROVIDER_SITE_OTHER): Payer: Self-pay

## 2022-04-11 ENCOUNTER — Other Ambulatory Visit: Payer: Self-pay | Admitting: *Deleted

## 2022-04-11 DIAGNOSIS — N529 Male erectile dysfunction, unspecified: Secondary | ICD-10-CM

## 2022-04-11 MED ORDER — TADALAFIL 20 MG PO TABS
ORAL_TABLET | ORAL | 3 refills | Status: DC
Start: 2022-04-11 — End: 2022-11-27

## 2022-04-15 ENCOUNTER — Ambulatory Visit (HOSPITAL_COMMUNITY)
Admission: RE | Admit: 2022-04-15 | Discharge: 2022-04-15 | Disposition: A | Discharge status: Home / Self Care | Payer: Medicare HMO | Attending: Internal Medicine | Admitting: Internal Medicine

## 2022-04-15 ENCOUNTER — Encounter (HOSPITAL_COMMUNITY): Payer: Self-pay | Admitting: Internal Medicine

## 2022-04-15 ENCOUNTER — Encounter (HOSPITAL_COMMUNITY)
Admission: RE | Disposition: A | Discharge status: Home / Self Care | Payer: Self-pay | Source: Home / Self Care | Attending: Internal Medicine

## 2022-04-15 DIAGNOSIS — D509 Iron deficiency anemia, unspecified: Secondary | ICD-10-CM | POA: Insufficient documentation

## 2022-04-15 DIAGNOSIS — K449 Diaphragmatic hernia without obstruction or gangrene: Secondary | ICD-10-CM | POA: Diagnosis not present

## 2022-04-15 DIAGNOSIS — K219 Gastro-esophageal reflux disease without esophagitis: Secondary | ICD-10-CM | POA: Diagnosis not present

## 2022-04-15 DIAGNOSIS — K2270 Barrett's esophagus without dysplasia: Secondary | ICD-10-CM | POA: Insufficient documentation

## 2022-04-15 HISTORY — PX: GIVENS CAPSULE STUDY: SHX5432

## 2022-04-15 SURGERY — IMAGING PROCEDURE, GI TRACT, INTRALUMINAL, VIA CAPSULE

## 2022-04-16 ENCOUNTER — Encounter (HOSPITAL_COMMUNITY): Payer: Self-pay | Admitting: Internal Medicine

## 2022-04-18 NOTE — Op Note (Signed)
Small Bowel Givens Capsule Study ?Procedure date: 04/15/2022 ? ?Referring Provider: Dr. Corky Downs, MD ?PCP:  Dr. Corky Downs, MD ? ?Indication for procedure:   ? ?Patient is 71 year old [place] American male who was found to have iron deficiency anemia back in July 2022 when his hemoglobin was down to 7.8 g.  His stool was guaiac negative.  His anemia corrected with iron supplement.  He underwent esophagogastroduodenoscopy and colonoscopy in October 2022.  He was found to have short segment Barrett's esophagus and 7 cm sliding hiatal hernia but no bleeding lesion was identified.  Duodenal mucosa was normal.  Colonoscopy was normal except external hemorrhoids.  While his hemoglobin has remained normal his serum iron saturation has dropped to 11%.  There is no history of melena or rectal bleeding.  He is suspected to be bleeding intermittently.  GERD symptoms are well controlled with dietary measures and PPI. ?He is undergoing small bowel given capsule study to complete his GI work-up and looking for source of suspected GI bleed and iron deficiency anemia. ? ?Findings:   ? ?Patient was able to swallow given capsule without any difficulty. ?Study duration 8 hours.  Study is complete as capsule did not reach: Within the study. ?No abnormality noted to gastric mucosa. ?Normal small bowel mucosa. ?Quality of images excellent. ? ?First Gastric image: 23 sec ?First Duodenal image: 5 min and 43 sec ?First Ileo-Cecal Valve image: 3 hours 58 min and 40 sec ?First Cecal image: 4 hours 3 min and 45 sec ?Gastric Passage time: 5 min and 20 sec ?Small Bowel Passage time: 3 hrs and 58 min ? ?Summary & Recommendations: ? ?Normal small bowel study. ?Results reviewed with patient over the phone ?Patient advised to take ferrous sulfate 325 mg 3 times a week. ?He will call office if he has melena or rectal bleeding. ?We recommend CBC, iron studies and celiac disease panel in 3 months by Dr. Juel Burrow. ?  ?

## 2022-05-06 ENCOUNTER — Ambulatory Visit: Payer: Medicare HMO | Admitting: Internal Medicine

## 2022-05-15 ENCOUNTER — Encounter: Payer: Self-pay | Admitting: Internal Medicine

## 2022-05-15 ENCOUNTER — Ambulatory Visit (INDEPENDENT_AMBULATORY_CARE_PROVIDER_SITE_OTHER): Payer: Medicare HMO | Admitting: Internal Medicine

## 2022-05-15 VITALS — BP 158/90 | HR 108 | Ht 70.0 in | Wt 206.4 lb

## 2022-05-15 DIAGNOSIS — D500 Iron deficiency anemia secondary to blood loss (chronic): Secondary | ICD-10-CM | POA: Diagnosis not present

## 2022-05-15 DIAGNOSIS — K2930 Chronic superficial gastritis without bleeding: Secondary | ICD-10-CM

## 2022-05-15 DIAGNOSIS — E781 Pure hyperglyceridemia: Secondary | ICD-10-CM

## 2022-05-15 DIAGNOSIS — N401 Enlarged prostate with lower urinary tract symptoms: Secondary | ICD-10-CM

## 2022-05-15 DIAGNOSIS — D509 Iron deficiency anemia, unspecified: Secondary | ICD-10-CM | POA: Diagnosis not present

## 2022-05-15 DIAGNOSIS — I10 Essential (primary) hypertension: Secondary | ICD-10-CM | POA: Diagnosis not present

## 2022-05-15 DIAGNOSIS — M722 Plantar fascial fibromatosis: Secondary | ICD-10-CM | POA: Diagnosis not present

## 2022-05-15 DIAGNOSIS — R3911 Hesitancy of micturition: Secondary | ICD-10-CM

## 2022-05-15 DIAGNOSIS — K2100 Gastro-esophageal reflux disease with esophagitis, without bleeding: Secondary | ICD-10-CM

## 2022-05-15 DIAGNOSIS — J301 Allergic rhinitis due to pollen: Secondary | ICD-10-CM

## 2022-05-15 NOTE — Assessment & Plan Note (Signed)

## 2022-05-15 NOTE — Assessment & Plan Note (Signed)
Take Claritin 5 mg p.o. daily 

## 2022-05-15 NOTE — Assessment & Plan Note (Signed)
Stable

## 2022-05-15 NOTE — Assessment & Plan Note (Signed)
Resolved

## 2022-05-15 NOTE — Progress Notes (Signed)
Established Patient Office Visit  Subjective:  Patient ID: Zachary Taylor, Zachary Taylor    DOB: 08/03/1951  Age: 71 y.o. MRN: [id number]  CC:  Chief Complaint  Patient presents with   Foot Pain    Patient complains of left foot pain, that is chronic. Patient states he has difficulty walking and more pain in the mornings, then symptoms improve later in the day.    Foot Pain   Zachary Taylor presents for left plantar fasciitis of left foot  Past Medical History:  Diagnosis Date   Hypertension     Past Surgical History:  Procedure Laterality Date   BIOPSY  09/26/2021   Procedure: BIOPSY;  Surgeon: Rogene Houston, MD;  [facility]: [facility];  Service: Endoscopy;;   COLONOSCOPY WITH PROPOFOL N/A 09/26/2021   Procedure: COLONOSCOPY WITH PROPOFOL;  Surgeon: Rogene Houston, MD;  [facility]: [facility];  Service: Endoscopy;  Laterality: N/A;  2:20   ESOPHAGOGASTRODUODENOSCOPY (EGD) WITH PROPOFOL N/A 09/26/2021   Procedure: ESOPHAGOGASTRODUODENOSCOPY (EGD) WITH PROPOFOL;  Surgeon: Rogene Houston, MD;  [facility]: [facility];  Service: Endoscopy;  Laterality: N/A;   GIVENS CAPSULE STUDY N/A 04/15/2022   Procedure: GIVENS CAPSULE STUDY;  Surgeon: Rogene Houston, MD;  Location: [facility];  Service: Endoscopy;  Laterality: N/A;  730    No family history on file.  Social History   Socioeconomic History   Marital status: Married    Spouse name: Not on file   Number of children: Not on file   Years of education: Not on file   Highest education level: Not on file  Occupational History   Not on file  Tobacco Use   Smoking status: Never   Smokeless tobacco: Never  Substance and Sexual Activity   Alcohol use: No   Drug use: No   Sexual activity: Not on file  Other Topics Concern   Not on file  Social History Narrative   Not on file   Social Determinants of Health   Financial Resource Strain: Not on file  Food Insecurity: Not on file  Transportation Needs: Not on file  Physical  Activity: Not on file  Stress: Not on file  Social Connections: Not on file  Intimate Partner Violence: Not on file     Current Outpatient Medications:    Cholecalciferol (VITAMIN D3 PO), Take 1 tablet by mouth daily., Disp: , Rfl:    ferrous sulfate 325 (65 FE) MG EC tablet, Take 325 mg by mouth daily with breakfast., Disp: , Rfl:    losartan-hydrochlorothiazide (HYZAAR) 100-25 MG tablet, Take 1 tablet by mouth daily., Disp: 90 tablet, Rfl: 3   Multiple Vitamin (MULTIVITAMIN WITH MINERALS) TABS tablet, Take 1 tablet by mouth daily., Disp: , Rfl:    omeprazole (PRILOSEC) 20 MG capsule, Take 1 capsule (20 mg total) by mouth daily before breakfast., Disp: 90 capsule, Rfl: 3   pregabalin (LYRICA) 50 MG capsule, Take 1 capsule (50 mg total) by mouth 3 (three) times daily. (Patient taking differently: Take 50 mg by mouth in the morning.), Disp: 90 capsule, Rfl: 2   tadalafil (CIALIS) 20 MG tablet, As directed, Disp: 5 tablet, Rfl: 3   triamcinolone cream (KENALOG) 0.5 %, Apply 1 application topically 3 (three) times daily., Disp: 30 g, Rfl: 3   No Known Allergies  ROS Review of Systems  Constitutional: Negative.   HENT: Negative.    Eyes: Negative.   Respiratory: Negative.    Cardiovascular: Negative.   Gastrointestinal: Negative.  Endocrine: Negative.   Genitourinary: Negative.   Musculoskeletal: Negative.   Skin: Negative.   Allergic/Immunologic: Negative.   Neurological: Negative.   Hematological: Negative.   Psychiatric/Behavioral: Negative.    All other systems reviewed and are negative.    Objective:    Physical Exam Vitals reviewed.  Constitutional:      Appearance: Normal appearance.  HENT:     Mouth/Throat:     Mouth: Mucous membranes are moist.  Eyes:     Pupils: Pupils are equal, round, and reactive to light.  Neck:     Vascular: No carotid bruit.  Cardiovascular:     Rate and Rhythm: Normal rate and regular rhythm.     Pulses: Normal pulses.     Heart  sounds: Normal heart sounds.  Pulmonary:     Effort: Pulmonary effort is normal.     Breath sounds: Normal breath sounds.  Abdominal:     General: Bowel sounds are normal.     Palpations: Abdomen is soft. There is no hepatomegaly, splenomegaly or mass.     Tenderness: There is no abdominal tenderness.     Hernia: No hernia is present.  Musculoskeletal:     Cervical back: Neck supple.     Right lower leg: No edema.     Left lower leg: No edema.  Skin:    Findings: No rash.  Neurological:     Mental Status: He is alert and oriented to person, place, and time.     Motor: No weakness.  Psychiatric:        Mood and Affect: Mood normal.        Behavior: Behavior normal.    BP (!) 158/90   Pulse (!) 108   Ht _0  (1.778 m)   Wt 206 lb 6.4 oz (93.6 kg)   BMI 29.62 kg/m  Wt Readings from Last 3 Encounters:  05/15/22 206 lb 6.4 oz (93.6 kg)  03/05/22 205 lb 1.6 oz (93 kg)  09/24/21 200 lb (90.7 kg)     Health Maintenance Due  Topic Date Due   Hepatitis C Screening  Never done   TETANUS/TDAP  Never done   Zoster Vaccines- Shingrix (1 of 2) Never done   Pneumonia Vaccine 36+ Years old (1 - PCV) Never done   COVID-19 Vaccine (5 - Booster for Pfizer series) 08/28/2021    There are no preventive care reminders to display for this patient.  Lab Results  Component Value Date   TSH 2.00 07/09/2021   Lab Results  Component Value Date   WBC 5.2 03/05/2022   HGB 15.7 03/05/2022   HCT 48.5 03/05/2022   MCV 84.8 03/05/2022   PLT 220 03/05/2022   Lab Results  Component Value Date   NA 135 09/24/2021   K 3.7 09/24/2021   CO2 24 09/24/2021   GLUCOSE 101 (H) 09/24/2021   BUN 16 09/24/2021   CREATININE 1.02 09/24/2021   BILITOT 0.4 07/19/2021   ALKPHOS 59 07/19/2021   AST 21 07/19/2021   ALT 18 07/19/2021   PROT 7.5 07/19/2021   ALBUMIN 4.1 07/19/2021   CALCIUM 9.1 09/24/2021   ANIONGAP 9 09/24/2021   EGFR 72 07/09/2021   Lab Results  Component Value Date   CHOL  150 07/09/2021   Lab Results  Component Value Date   HDL 36 (L) 07/09/2021   Lab Results  Component Value Date   LDLCALC 92 07/09/2021   Lab Results  Component Value Date   TRIG 120 07/09/2021  Lab Results  Component Value Date   CHOLHDL 4.2 07/09/2021   No results found for: HGBA1C    Assessment & Plan:   Problem List Items Addressed This Visit       Cardiovascular and Mediastinum   Essential hypertension - Primary     Patient denies any chest pain or shortness of breath there is no history of palpitation or paroxysmal nocturnal dyspnea   patient was advised to follow low-salt low-cholesterol diet    ideally I want to keep systolic blood pressure below 130 mmHg, patient was asked to check blood pressure one times a week and give me a report on that.  Patient will be follow-up in 3 months  or earlier as needed, patient will call me back for any change in the cardiovascular symptoms Patient was advised to buy a book from local bookstore concerning blood pressure and read several chapters  every day.  This will be supplemented by some of the material we will give him from the office.  Patient should also utilize other resources like YouTube and Internet to learn more about the blood pressure and the diet.         Respiratory   Seasonal allergic rhinitis due to pollen    Take Claritin 5 mg p.o. daily         Digestive   Superficial gastritis without hemorrhage   Relevant Orders   Celiac Disease Panel   Iron, TIBC and Ferritin Panel   B12 and Folate Panel   CBC with Differential/Platelet   Gastroesophageal reflux disease with esophagitis without hemorrhage    Resolved         Musculoskeletal and Integument   Plantar fasciitis of left foot    Under complete sterile mild condition with preparation of the left foot with  Betadine antiseptic, Left heel was injected with 1% lidocaine Next 20 mg of Kenalog was injected at the same site. Patient tolerated the  procedure well         Genitourinary   Benign prostatic hyperplasia with urinary hesitancy    Stable         Other   Anemia    We will check CBC and iron binding capacity.  Serum iron level, ferritin level Also check celiac disease panel       Relevant Orders   Celiac Disease Panel   Iron, TIBC and Ferritin Panel   B12 and Folate Panel   CBC with Differential/Platelet   Celiac Disease Panel   Iron, TIBC and Ferritin Panel   B12 and Folate Panel   CBC with Differential/Platelet   Iron deficiency anemia due to chronic blood loss   Relevant Orders   Celiac Disease Panel   Iron, TIBC and Ferritin Panel   B12 and Folate Panel   CBC with Differential/Platelet    No orders of the defined types were placed in this encounter.   Follow-up: No follow-ups on file.    Cletis Athens, MD

## 2022-05-15 NOTE — Assessment & Plan Note (Signed)
We will check CBC and iron binding capacity.  Serum iron level, ferritin level Also check celiac disease panel

## 2022-05-15 NOTE — Addendum Note (Signed)
Addended by: Melody Comas L on: 05/15/2022 11:13 AM   Modules accepted: Orders

## 2022-05-15 NOTE — Assessment & Plan Note (Signed)
Under complete sterile mild condition with preparation of the left foot with  Betadine antiseptic, Left heel was injected with 1% lidocaine Next 20 mg of Kenalog was injected at the same site. Patient tolerated the procedure well

## 2022-05-16 LAB — CELIAC DISEASE PANEL
(tTG) Ab, IgA: <1 U/mL
(tTG) Ab, IgG: <1 U/mL
Gliadin IgA: <1 U/mL
Gliadin IgG: <1 U/mL
Immunoglobulin A: 231 mg/dL (ref 70–320)

## 2022-05-16 LAB — CBC WITH DIFFERENTIAL/PLATELET
Absolute Monocytes: 454 cells/uL (ref 200–950)
Basophils Absolute: 62 cells/uL (ref 0–200)
Basophils Relative: 1.1 %
Eosinophils Absolute: 302 cells/uL (ref 15–500)
Eosinophils Relative: 5.4 %
HCT: 44.6 % (ref 38.5–50.0)
Hemoglobin: 14.7 g/dL (ref 13.2–17.1)
Lymphs Abs: 1170 cells/uL (ref 850–3900)
MCH: 27.9 pg (ref 27.0–33.0)
MCHC: 33 g/dL (ref 32.0–36.0)
MCV: 84.6 fL (ref 80.0–100.0)
MPV: 11.1 fL (ref 7.5–12.5)
Monocytes Relative: 8.1 %
Neutro Abs: 3612 cells/uL (ref 1500–7800)
Neutrophils Relative %: 64.5 %
Platelets: 242 10*3/uL (ref 140–400)
RBC: 5.27 10*6/uL (ref 4.20–5.80)
RDW: 13.9 % (ref 11.0–15.0)
Total Lymphocyte: 20.9 %
WBC: 5.6 10*3/uL (ref 3.8–10.8)

## 2022-05-16 LAB — LIPID PANEL
Cholesterol: 176 mg/dL (ref <200)
HDL: 32 mg/dL — ABNORMAL LOW (ref >=40)
Non-HDL Cholesterol (Calc): 144 mg/dL (calc) — ABNORMAL HIGH (ref <130)
Total CHOL/HDL Ratio: 5.5 (calc) — ABNORMAL HIGH (ref <5.0)
Triglycerides: 528 mg/dL — ABNORMAL HIGH (ref <150)

## 2022-05-16 LAB — IRON,TIBC AND FERRITIN PANEL
%SAT: 14 % (calc) — ABNORMAL LOW (ref 20–48)
Ferritin: 16 ng/mL — ABNORMAL LOW (ref 24–380)
Iron: 58 ug/dL (ref 50–180)
TIBC: 414 mcg/dL (calc) (ref 250–425)

## 2022-05-16 LAB — B12 AND FOLATE PANEL
Folate: >24 ng/mL
Vitamin B-12: 924 pg/mL (ref 200–1100)

## 2022-05-17 ENCOUNTER — Other Ambulatory Visit: Payer: Self-pay | Admitting: Nurse Practitioner

## 2022-05-17 MED ORDER — FENOFIBRATE 145 MG PO TABS: 145.0000 mg | ORAL_TABLET | Freq: Every day | ORAL | 3 refills | Status: DC

## 2022-06-07 ENCOUNTER — Other Ambulatory Visit: Payer: Self-pay | Admitting: *Deleted

## 2022-07-17 ENCOUNTER — Ambulatory Visit (INDEPENDENT_AMBULATORY_CARE_PROVIDER_SITE_OTHER): Payer: Medicare HMO | Admitting: *Deleted

## 2022-07-17 DIAGNOSIS — E781 Pure hyperglyceridemia: Secondary | ICD-10-CM | POA: Diagnosis not present

## 2022-07-18 LAB — LIPID PANEL
Cholesterol: 173 mg/dL (ref <200)
HDL: 41 mg/dL (ref >=40)
LDL Cholesterol (Calc): 107 mg/dL (calc) — ABNORMAL HIGH
Non-HDL Cholesterol (Calc): 132 mg/dL (calc) — ABNORMAL HIGH (ref <130)
Total CHOL/HDL Ratio: 4.2 (calc) (ref <5.0)
Triglycerides: 140 mg/dL (ref <150)

## 2022-07-29 ENCOUNTER — Other Ambulatory Visit: Payer: Self-pay | Admitting: *Deleted

## 2022-07-29 DIAGNOSIS — I10 Essential (primary) hypertension: Secondary | ICD-10-CM

## 2022-07-29 MED ORDER — HYDROXYZINE HCL 50 MG PO TABS: 50.0000 mg | ORAL_TABLET | Freq: Three times a day (TID) | ORAL | 0 refills | Status: DC | PRN

## 2022-07-29 MED ORDER — LOSARTAN POTASSIUM-HCTZ 100-25 MG PO TABS: 1.0000 | ORAL_TABLET | Freq: Every day | ORAL | 3 refills | Status: DC

## 2022-08-13 ENCOUNTER — Other Ambulatory Visit: Payer: Self-pay | Admitting: Nurse Practitioner

## 2022-10-01 ENCOUNTER — Encounter: Payer: Self-pay | Admitting: Internal Medicine

## 2022-10-01 ENCOUNTER — Ambulatory Visit (INDEPENDENT_AMBULATORY_CARE_PROVIDER_SITE_OTHER): Payer: Medicare HMO | Admitting: Internal Medicine

## 2022-10-01 VITALS — BP 119/65 | HR 87 | Ht 70.0 in | Wt 218.4 lb

## 2022-10-01 DIAGNOSIS — K2100 Gastro-esophageal reflux disease with esophagitis, without bleeding: Secondary | ICD-10-CM | POA: Diagnosis not present

## 2022-10-01 DIAGNOSIS — D509 Iron deficiency anemia, unspecified: Secondary | ICD-10-CM

## 2022-10-01 DIAGNOSIS — E781 Pure hyperglyceridemia: Secondary | ICD-10-CM | POA: Diagnosis not present

## 2022-10-01 DIAGNOSIS — D649 Anemia, unspecified: Secondary | ICD-10-CM

## 2022-10-01 DIAGNOSIS — I10 Essential (primary) hypertension: Secondary | ICD-10-CM | POA: Diagnosis not present

## 2022-10-01 DIAGNOSIS — G629 Polyneuropathy, unspecified: Secondary | ICD-10-CM

## 2022-10-01 MED ORDER — PREGABALIN 50 MG PO CAPS: 50.0000 mg | ORAL_CAPSULE | Freq: Three times a day (TID) | ORAL | 2 refills | Status: DC

## 2022-10-01 NOTE — Assessment & Plan Note (Signed)

## 2022-10-01 NOTE — Assessment & Plan Note (Signed)
Stable

## 2022-10-01 NOTE — Assessment & Plan Note (Signed)
We will start the patient on Lyrica for sacroiliitis

## 2022-10-01 NOTE — Progress Notes (Signed)
Established Patient Office Visit  Subjective:  Patient ID: Zachary Taylor, male    DOB: 1951/11/06  Age: 71 y.o. MRN: [id number]  CC:  Chief Complaint  Patient presents with   Back Pain    Patient having low back pain for the past couple of days.     Back Pain This is a new problem. The current episode started 1 to 4 weeks ago. The problem occurs 2 to 4 times per day. The problem has been waxing and waning since onset. The quality of the pain is described as burning and cramping. The pain is at a severity of 5/10. The pain is moderate. The pain is Worse during the night. The symptoms are aggravated by sitting, standing and position. Associated symptoms include numbness. Pertinent negatives include no abdominal pain, bladder incontinence, bowel incontinence, chest pain, dysuria, fever, headaches, leg pain, paresis, paresthesias, tingling, weakness or weight loss.    Zachary Taylor presents for back pain  Past Medical History:  Diagnosis Date   Hypertension     Past Surgical History:  Procedure Laterality Date   BIOPSY  09/26/2021   Procedure: BIOPSY;  Surgeon: Rogene Houston, MD;  [facility]: [facility];  Service: Endoscopy;;   COLONOSCOPY WITH PROPOFOL N/A 09/26/2021   Procedure: COLONOSCOPY WITH PROPOFOL;  Surgeon: Rogene Houston, MD;  [facility]: [facility];  Service: Endoscopy;  Laterality: N/A;  2:20   ESOPHAGOGASTRODUODENOSCOPY (EGD) WITH PROPOFOL N/A 09/26/2021   Procedure: ESOPHAGOGASTRODUODENOSCOPY (EGD) WITH PROPOFOL;  Surgeon: Rogene Houston, MD;  [facility];  Service: Endoscopy;  Laterality: N/A;   GIVENS CAPSULE STUDY N/A 04/15/2022   Procedure: GIVENS CAPSULE STUDY;  Surgeon: Rogene Houston, MD;  [facility]: [facility];  Service: Endoscopy;  Laterality: N/A;  730    History reviewed. No pertinent family history.  Social History   Socioeconomic History   Marital status: Married    Spouse name: Not on file   Number of children: Not on file   Years of  education: Not on file   Highest education level: Not on file  Occupational History   Not on file  Tobacco Use   Smoking status: Never   Smokeless tobacco: Never  Substance and Sexual Activity   Alcohol use: No   Drug use: No   Sexual activity: Not on file  Other Topics Concern   Not on file  Social History Narrative   Not on file   Social Determinants of Health   Financial Resource Strain: Not on file  Food Insecurity: Not on file  Transportation Needs: Not on file  Physical Activity: Not on file  Stress: Not on file  Social Connections: Not on file  Intimate Partner Violence: Not on file     Current Outpatient Medications:    Cholecalciferol (VITAMIN D3 PO), Take 1 tablet by mouth daily., Disp: , Rfl:    fenofibrate (TRICOR) 145 MG tablet, TAKE 1 TABLET BY MOUTH EVERY DAY, Disp: 90 tablet, Rfl: 1   ferrous sulfate 325 (65 FE) MG EC tablet, Take 325 mg by mouth daily with breakfast., Disp: , Rfl:    hydrOXYzine (ATARAX) 50 MG tablet, Take 1 tablet (50 mg total) by mouth 3 (three) times daily as needed., Disp: 30 tablet, Rfl: 0   losartan-hydrochlorothiazide (HYZAAR) 100-25 MG tablet, Take 1 tablet by mouth daily., Disp: 90 tablet, Rfl: 3   Multiple Vitamin (MULTIVITAMIN WITH MINERALS) TABS tablet, Take 1 tablet by mouth daily., Disp: , Rfl:  omeprazole (PRILOSEC) 20 MG capsule, Take 1 capsule (20 mg total) by mouth daily before breakfast., Disp: 90 capsule, Rfl: 3   tadalafil (CIALIS) 20 MG tablet, As directed, Disp: 5 tablet, Rfl: 3   triamcinolone cream (KENALOG) 0.5 %, Apply 1 application topically 3 (three) times daily., Disp: 30 g, Rfl: 3   pregabalin (LYRICA) 50 MG capsule, Take 1 capsule (50 mg total) by mouth 3 (three) times daily., Disp: 90 capsule, Rfl: 2   No Known Allergies  ROS Review of Systems  Constitutional:  Negative for fever and weight loss.  Cardiovascular:  Negative for chest pain.  Gastrointestinal:  Negative for abdominal pain and bowel  incontinence.  Genitourinary:  Negative for bladder incontinence and dysuria.  Musculoskeletal:  Positive for back pain.  Neurological:  Positive for numbness. Negative for tingling, weakness, headaches and paresthesias.      Objective:    Physical Exam Vitals reviewed.  Constitutional:      Appearance: Normal appearance.  HENT:     Mouth/Throat:     Mouth: Mucous membranes are moist.  Eyes:     Pupils: Pupils are equal, round, and reactive to light.  Neck:     Vascular: No carotid bruit.  Cardiovascular:     Rate and Rhythm: Normal rate and regular rhythm.     Pulses: Normal pulses.     Heart sounds: Normal heart sounds.  Pulmonary:     Effort: Pulmonary effort is normal.     Breath sounds: Normal breath sounds.  Abdominal:     General: Bowel sounds are normal.     Palpations: Abdomen is soft. There is no hepatomegaly, splenomegaly or mass.     Tenderness: There is no abdominal tenderness.     Hernia: No hernia is present.  Musculoskeletal:     Cervical back: Neck supple.     Right lower leg: No edema.     Left lower leg: No edema.  Skin:    Findings: No rash.  Neurological:     Mental Status: He is alert and oriented to person, place, and time.     Motor: No weakness.  Psychiatric:        Mood and Affect: Mood normal.        Behavior: Behavior normal.     BP 119/65   Pulse 87   Ht 5' 10"  (1.778 m)   Wt 218 lb 6.4 oz (99.1 kg)   BMI 31.34 kg/m  Wt Readings from Last 3 Encounters:  10/01/22 218 lb 6.4 oz (99.1 kg)  05/15/22 206 lb 6.4 oz (93.6 kg)  03/05/22 205 lb 1.6 oz (93 kg)     Health Maintenance Due  Topic Date Due   COVID-19 Vaccine (6 - Pfizer series) 08/28/2021    There are no preventive care reminders to display for this patient.  Lab Results  Component Value Date   TSH 2.00 07/09/2021   Lab Results  Component Value Date   WBC 5.6 05/15/2022   HGB 14.7 05/15/2022   HCT 44.6 05/15/2022   MCV 84.6 05/15/2022   PLT 242 05/15/2022    Lab Results  Component Value Date   NA 135 09/24/2021   K 3.7 09/24/2021   CO2 24 09/24/2021   GLUCOSE 101 (H) 09/24/2021   BUN 16 09/24/2021   CREATININE 1.02 09/24/2021   BILITOT 0.4 07/19/2021   ALKPHOS 59 07/19/2021   AST 21 07/19/2021   ALT 18 07/19/2021   PROT 7.5 07/19/2021   ALBUMIN 4.1 07/19/2021  CALCIUM 9.1 09/24/2021   ANIONGAP 9 09/24/2021   EGFR 72 07/09/2021   Lab Results  Component Value Date   CHOL 173 07/17/2022   Lab Results  Component Value Date   HDL 41 07/17/2022   Lab Results  Component Value Date   LDLCALC 107 (H) 07/17/2022   Lab Results  Component Value Date   TRIG 140 07/17/2022   Lab Results  Component Value Date   CHOLHDL 4.2 07/17/2022   No results found for: "HGBA1C"    Assessment & Plan:   Problem List Items Addressed This Visit       Cardiovascular and Mediastinum   Essential hypertension     Patient denies any chest pain or shortness of breath there is no history of palpitation or paroxysmal nocturnal dyspnea   patient was advised to follow low-salt low-cholesterol diet    ideally I want to keep systolic blood pressure below 130 mmHg, patient was asked to check blood pressure one times a week and give me a report on that.  Patient will be follow-up in 3 months  or earlier as needed, patient will call me back for any change in the cardiovascular symptoms Patient was advised to buy a book from local bookstore concerning blood pressure and read several chapters  every day.  This will be supplemented by some of the material we will give him from the office.  Patient should also utilize other resources like YouTube and Internet to learn more about the blood pressure and the diet.        Digestive   Gastroesophageal reflux disease with esophagitis without hemorrhage    Counseling  If a person has gastroesophageal reflux disease (GERD), food and stomach acid move back up into the esophagus and cause symptoms or problems such  as damage to the esophagus.  Anti-reflux measures include: raising the head of the bed, avoiding tight clothing or belts, avoiding eating late at night, not lying down shortly after mealtime, and achieving weight loss.  Avoid ASA, NSAID's, caffeine, alcohol, and tobacco.   OTC Pepcid and/or Tums are often very helpful for as needed use.   However, for persisting chronic or daily symptoms, stronger medications like Omeprazole may be needed.  You may need to avoid foods and drinks such as: ? Coffee and tea (with or without caffeine). ? Drinks that contain alcohol. ? Energy drinks and sports drinks. ? Bubbly (carbonated) drinks or sodas. ? Chocolate and cocoa. ? Peppermint and mint flavorings. ? Garlic and onions. ? Horseradish. ? Spicy and acidic foods. These include peppers, chili powder, curry powder, vinegar, hot sauces, and BBQ sauce. ? Citrus fruit juices and citrus fruits, such as oranges, lemons, and limes. ? Tomato-based foods. These include red sauce, chili, salsa, and pizza with red sauce. ? Fried and fatty foods. These include donuts, french fries, potato chips, and high-fat dressings. ? High-fat meats. These include hot dogs, rib eye steak, sausage, ham, and bacon.         Nervous and Auditory   Neuropathy    We will start the patient on Lyrica for sacroiliitis      Relevant Medications   pregabalin (LYRICA) 50 MG capsule     Other   Anemia    Stable      Iron deficiency anemia    Stable      High triglycerides - Primary    Lose weight Hypercholesterolemia  I advised the patient to follow Mediterranean diet This diet is rich in fruits  vegetables and whole grain, and This diet is also rich in fish and lean meat Patient should also eat a handful of almonds or walnuts daily Recent heart study indicated that average follow-up on this kind of diet reduces the cardiovascular mortality by 50 to 70%==       Meds ordered this encounter  Medications    pregabalin (LYRICA) 50 MG capsule    Sig: Take 1 capsule (50 mg total) by mouth 3 (three) times daily.    Dispense:  90 capsule    Refill:  2    Follow-up: No follow-ups on file.    Cletis Athens, MD

## 2022-10-01 NOTE — Assessment & Plan Note (Signed)

## 2022-10-01 NOTE — Assessment & Plan Note (Signed)
Lose weight Hypercholesterolemia  I advised the patient to follow Mediterranean diet This diet is rich in fruits vegetables and whole grain, and This diet is also rich in fish and lean meat Patient should also eat a handful of almonds or walnuts daily Recent heart study indicated that average follow-up on this kind of diet reduces the cardiovascular mortality by 50 to 70%==

## 2022-10-15 ENCOUNTER — Encounter: Payer: Self-pay | Admitting: Internal Medicine

## 2022-10-15 ENCOUNTER — Ambulatory Visit (INDEPENDENT_AMBULATORY_CARE_PROVIDER_SITE_OTHER): Payer: Medicare HMO | Admitting: Internal Medicine

## 2022-10-15 VITALS — BP 122/74 | HR 82 | Ht 70.0 in | Wt 214.4 lb

## 2022-10-15 DIAGNOSIS — I10 Essential (primary) hypertension: Secondary | ICD-10-CM | POA: Diagnosis not present

## 2022-10-15 DIAGNOSIS — D649 Anemia, unspecified: Secondary | ICD-10-CM

## 2022-10-15 DIAGNOSIS — G629 Polyneuropathy, unspecified: Secondary | ICD-10-CM | POA: Diagnosis not present

## 2022-10-15 DIAGNOSIS — J301 Allergic rhinitis due to pollen: Secondary | ICD-10-CM | POA: Diagnosis not present

## 2022-10-15 DIAGNOSIS — K2100 Gastro-esophageal reflux disease with esophagitis, without bleeding: Secondary | ICD-10-CM

## 2022-10-15 DIAGNOSIS — E8881 Metabolic syndrome: Secondary | ICD-10-CM

## 2022-10-15 NOTE — Assessment & Plan Note (Signed)
Advised to take Claritin 10 mg p.o. as needed

## 2022-10-15 NOTE — Assessment & Plan Note (Signed)
We will check hemoglobin serum iron ferritin level

## 2022-10-15 NOTE — Assessment & Plan Note (Signed)

## 2022-10-15 NOTE — Assessment & Plan Note (Signed)
Stable at the present time. 

## 2022-10-15 NOTE — Progress Notes (Signed)
Established Patient Office Visit  Subjective:  Patient ID: Zachary Taylor, male    DOB: 12-20-1951  Age: 71 y.o. MRN: [id number]  CC:  Chief Complaint  Patient presents with   Medicare Wellness    HPI  Zachary Taylor presents for physical  Past Medical History:  Diagnosis Date   Hypertension     Past Surgical History:  Procedure Laterality Date   BIOPSY  09/26/2021   Procedure: BIOPSY;  Surgeon: Rogene Houston, MD;  [facility]: [facility];  Service: Endoscopy;;   COLONOSCOPY WITH PROPOFOL N/A 09/26/2021   Procedure: COLONOSCOPY WITH PROPOFOL;  Surgeon: Rogene Houston, MD;  [facility]: [facility];  Service: Endoscopy;  Laterality: N/A;  2:20   ESOPHAGOGASTRODUODENOSCOPY (EGD) WITH PROPOFOL N/A 09/26/2021   Procedure: ESOPHAGOGASTRODUODENOSCOPY (EGD) WITH PROPOFOL;  Surgeon: Rogene Houston, MD;  [facility]: [facility];  Service: Endoscopy;  Laterality: N/A;   GIVENS CAPSULE STUDY N/A 04/15/2022   Procedure: GIVENS CAPSULE STUDY;  Surgeon: Rogene Houston, MD;  [facility]: [facility];  Service: Endoscopy;  Laterality: N/A;  730    History reviewed. No pertinent family history.  Social History   Socioeconomic History   Marital status: Married    Spouse name: Not on file   Number of children: Not on file   Years of education: Not on file   Highest education level: Not on file  Occupational History   Not on file  Tobacco Use   Smoking status: Never   Smokeless tobacco: Never  Substance and Sexual Activity   Alcohol use: No   Drug use: No   Sexual activity: Not on file  Other Topics Concern   Not on file  Social History Narrative   Not on file   Social Determinants of Health   Financial Resource Strain: Not on file  Food Insecurity: Not on file  Transportation Needs: Not on file  Physical Activity: Not on file  Stress: Not on file  Social Connections: Not on file  Intimate Partner Violence: Not on file     Current Outpatient Medications:     Cholecalciferol (VITAMIN D3 PO), Take 1 tablet by mouth daily., Disp: , Rfl:    fenofibrate (TRICOR) 145 MG tablet, TAKE 1 TABLET BY MOUTH EVERY DAY, Disp: 90 tablet, Rfl: 1   ferrous sulfate 325 (65 FE) MG EC tablet, Take 325 mg by mouth daily with breakfast., Disp: , Rfl:    hydrOXYzine (ATARAX) 50 MG tablet, Take 1 tablet (50 mg total) by mouth 3 (three) times daily as needed., Disp: 30 tablet, Rfl: 0   losartan-hydrochlorothiazide (HYZAAR) 100-25 MG tablet, Take 1 tablet by mouth daily., Disp: 90 tablet, Rfl: 3   Multiple Vitamin (MULTIVITAMIN WITH MINERALS) TABS tablet, Take 1 tablet by mouth daily., Disp: , Rfl:    omeprazole (PRILOSEC) 20 MG capsule, Take 1 capsule (20 mg total) by mouth daily before breakfast., Disp: 90 capsule, Rfl: 3   pregabalin (LYRICA) 50 MG capsule, Take 1 capsule (50 mg total) by mouth 3 (three) times daily., Disp: 90 capsule, Rfl: 2   tadalafil (CIALIS) 20 MG tablet, As directed, Disp: 5 tablet, Rfl: 3   triamcinolone cream (KENALOG) 0.5 %, Apply 1 application topically 3 (three) times daily., Disp: 30 g, Rfl: 3   No Known Allergies  ROS Review of Systems  Constitutional: Negative.   HENT: Negative.    Eyes: Negative.   Respiratory: Negative.    Cardiovascular: Negative.   Gastrointestinal: Negative.   Endocrine: Negative.  Genitourinary: Negative.   Musculoskeletal: Negative.   Skin: Negative.   Allergic/Immunologic: Negative.   Neurological: Negative.   Hematological: Negative.   Psychiatric/Behavioral: Negative.    All other systems reviewed and are negative.     Objective:    Physical Exam Vitals reviewed.  Constitutional:      Appearance: Normal appearance.  HENT:     Mouth/Throat:     Mouth: Mucous membranes are moist.  Eyes:     Pupils: Pupils are equal, round, and reactive to light.  Neck:     Vascular: No carotid bruit.  Cardiovascular:     Rate and Rhythm: Normal rate and regular rhythm.     Pulses: Normal pulses.     Heart  sounds: Normal heart sounds.  Pulmonary:     Effort: Pulmonary effort is normal.     Breath sounds: Normal breath sounds.  Abdominal:     General: Bowel sounds are normal.     Palpations: Abdomen is soft. There is no hepatomegaly, splenomegaly or mass.     Tenderness: There is no abdominal tenderness.     Hernia: No hernia is present.  Genitourinary:    Comments: declined Musculoskeletal:     Cervical back: Neck supple.     Right lower leg: No edema.     Left lower leg: No edema.  Skin:    Findings: No rash.  Neurological:     Mental Status: He is alert and oriented to person, place, and time.     Motor: No weakness.  Psychiatric:        Mood and Affect: Mood normal.        Behavior: Behavior normal.     BP 122/74   Pulse 82   Ht 5' 10"  (1.778 m)   Wt 214 lb 6.4 oz (97.3 kg)   BMI 30.76 kg/m  Wt Readings from Last 3 Encounters:  10/15/22 214 lb 6.4 oz (97.3 kg)  10/01/22 218 lb 6.4 oz (99.1 kg)  05/15/22 206 lb 6.4 oz (93.6 kg)     Health Maintenance Due  Topic Date Due   Medicare Annual Wellness (AWV)  06/18/2021   COVID-19 Vaccine (6 - Pfizer series) 08/28/2021    There are no preventive care reminders to display for this patient.  Lab Results  Component Value Date   TSH 2.00 07/09/2021   Lab Results  Component Value Date   WBC 5.6 05/15/2022   HGB 14.7 05/15/2022   HCT 44.6 05/15/2022   MCV 84.6 05/15/2022   PLT 242 05/15/2022   Lab Results  Component Value Date   NA 135 09/24/2021   K 3.7 09/24/2021   CO2 24 09/24/2021   GLUCOSE 101 (H) 09/24/2021   BUN 16 09/24/2021   CREATININE 1.02 09/24/2021   BILITOT 0.4 07/19/2021   ALKPHOS 59 07/19/2021   AST 21 07/19/2021   ALT 18 07/19/2021   PROT 7.5 07/19/2021   ALBUMIN 4.1 07/19/2021   CALCIUM 9.1 09/24/2021   ANIONGAP 9 09/24/2021   EGFR 72 07/09/2021   Lab Results  Component Value Date   CHOL 173 07/17/2022   Lab Results  Component Value Date   HDL 41 07/17/2022   Lab Results   Component Value Date   LDLCALC 107 (H) 07/17/2022   Lab Results  Component Value Date   TRIG 140 07/17/2022   Lab Results  Component Value Date   CHOLHDL 4.2 07/17/2022   No results found for: "HGBA1C"    Assessment & Plan:  Problem List Items Addressed This Visit       Cardiovascular and Mediastinum   Essential hypertension    Stable at the present time        Respiratory   Seasonal allergic rhinitis due to pollen    Advised to take Claritin 10 mg p.o. as needed        Digestive   Gastroesophageal reflux disease with esophagitis without hemorrhage    - The patient's GERD is stable on medication.  - Instructed the patient to avoid eating spicy and acidic foods, as well as foods high in fat. - Instructed the patient to avoid eating large meals or meals 2-3 hours prior to sleeping. We will check serum iron ferritin level and hemoglobin        Nervous and Auditory   Neuropathy    Under control        Other   Metabolic syndrome - Primary    - I encouraged the patient to lose weight.  - I educated them on making healthy dietary choices including eating more fruits and vegetables and less fried foods. - I encouraged the patient to exercise more, and educated on the benefits of exercise including weight loss, diabetes prevention, and hypertension prevention.   Dietary counseling with a registered dietician  Referral to a weight management support group (e.g. Weight Watchers, Overeaters Anonymous)  If your BMI is greater than 29 or you have gained more than 15 pounds you should work on weight loss.  Attend a healthy cooking class       Relevant Orders   Iron   Anemia    We will check hemoglobin serum iron ferritin level      Relevant Orders   CBC with Differential/Platelet   Iron, TIBC and Ferritin Panel   PSA  Patient came for physical examination, he denies any chest pain or shortness of breath no history of palpitation heart is regular chest is clear  abdomen is soft nontender there is no hepatosplenomegaly.  Prostate examination is declined it has been done on third of this year.  Patient was advised to lose weight follow Mediterranean diet  No orders of the defined types were placed in this encounter.   Follow-up: No follow-ups on file.    Cletis Athens, MD

## 2022-10-15 NOTE — Assessment & Plan Note (Signed)
-   The patient's GERD is stable on medication.  - Instructed the patient to avoid eating spicy and acidic foods, as well as foods high in fat. - Instructed the patient to avoid eating large meals or meals 2-3 hours prior to sleeping. We will check serum iron ferritin level and hemoglobin

## 2022-10-15 NOTE — Assessment & Plan Note (Signed)
Under control 

## 2022-10-16 LAB — CBC WITH DIFFERENTIAL/PLATELET
Absolute Monocytes: 675 cells/uL (ref 200–950)
Basophils Absolute: 43 cells/uL (ref 0–200)
Basophils Relative: 0.6 %
Eosinophils Absolute: 178 cells/uL (ref 15–500)
Eosinophils Relative: 2.5 %
HCT: 42.9 % (ref 38.5–50.0)
Hemoglobin: 14.3 g/dL (ref 13.2–17.1)
Lymphs Abs: 1718 cells/uL (ref 850–3900)
MCH: 27.5 pg (ref 27.0–33.0)
MCHC: 33.3 g/dL (ref 32.0–36.0)
MCV: 82.5 fL (ref 80.0–100.0)
MPV: 12.1 fL (ref 7.5–12.5)
Monocytes Relative: 9.5 %
Neutro Abs: 4487 cells/uL (ref 1500–7800)
Neutrophils Relative %: 63.2 %
Platelets: 217 10*3/uL (ref 140–400)
RBC: 5.2 10*6/uL (ref 4.20–5.80)
RDW: 13.8 % (ref 11.0–15.0)
Total Lymphocyte: 24.2 %
WBC: 7.1 10*3/uL (ref 3.8–10.8)

## 2022-10-16 LAB — PSA: PSA: 2.93 ng/mL (ref <=4.00)

## 2022-10-16 LAB — IRON,TIBC AND FERRITIN PANEL
%SAT: 15 % (calc) — ABNORMAL LOW (ref 20–48)
Ferritin: 14 ng/mL — ABNORMAL LOW (ref 24–380)
Iron: 68 ug/dL (ref 50–180)
TIBC: 463 mcg/dL (calc) — ABNORMAL HIGH (ref 250–425)

## 2022-11-18 ENCOUNTER — Ambulatory Visit (INDEPENDENT_AMBULATORY_CARE_PROVIDER_SITE_OTHER): Payer: Medicare HMO | Admitting: Internal Medicine

## 2022-11-18 ENCOUNTER — Encounter: Payer: Self-pay | Admitting: Internal Medicine

## 2022-11-18 VITALS — BP 134/87 | HR 102 | Temp 97.6°F | Ht 70.0 in | Wt 218.3 lb

## 2022-11-18 DIAGNOSIS — R1084 Generalized abdominal pain: Secondary | ICD-10-CM | POA: Insufficient documentation

## 2022-11-18 DIAGNOSIS — N401 Enlarged prostate with lower urinary tract symptoms: Secondary | ICD-10-CM

## 2022-11-18 DIAGNOSIS — R3911 Hesitancy of micturition: Secondary | ICD-10-CM | POA: Diagnosis not present

## 2022-11-18 DIAGNOSIS — I10 Essential (primary) hypertension: Secondary | ICD-10-CM | POA: Diagnosis not present

## 2022-11-18 NOTE — Assessment & Plan Note (Signed)
Patient has a positive Murphy sign, we will get a ultrasound of the abdomen, patient was advised to avoid fatty food and milk products, IBS diet was discussed

## 2022-11-18 NOTE — Assessment & Plan Note (Signed)

## 2022-11-18 NOTE — Progress Notes (Signed)
Established Patient Office Visit  Subjective:  Patient ID: Zachary Taylor, male    DOB: 1951/03/11  Age: 71 y.o. MRN: [id number]  CC:  Chief Complaint  Patient presents with   eye issue    Another doctor told him to see you    HPI  Zachary Taylor presents for abdominal pain  Past Medical History:  Diagnosis Date   Hypertension     Past Surgical History:  Procedure Laterality Date   BIOPSY  09/26/2021   Procedure: BIOPSY;  Surgeon: Rogene Houston, MD;  [facility]: [facility];  Service: Endoscopy;;   COLONOSCOPY WITH PROPOFOL N/A 09/26/2021   Procedure: COLONOSCOPY WITH PROPOFOL;  Surgeon: Rogene Houston, MD;  [facility];  Service: Endoscopy;  Laterality: N/A;  2:20   ESOPHAGOGASTRODUODENOSCOPY (EGD) WITH PROPOFOL N/A 09/26/2021   Procedure: ESOPHAGOGASTRODUODENOSCOPY (EGD) WITH PROPOFOL;  Surgeon: Rogene Houston, MD;  [facility];  Service: Endoscopy;  Laterality: N/A;   GIVENS CAPSULE STUDY N/A 04/15/2022   Procedure: GIVENS CAPSULE STUDY;  Surgeon: Rogene Houston, MD;  [facility]: [facility];  Service: Endoscopy;  Laterality: N/A;  730    History reviewed. No pertinent family history.  Social History   Socioeconomic History   Marital status: Married    Spouse name: Not on file   Number of children: Not on file   Years of education: Not on file   Highest education level: Not on file  Occupational History   Not on file  Tobacco Use   Smoking status: Never   Smokeless tobacco: Never  Substance and Sexual Activity   Alcohol use: No   Drug use: No   Sexual activity: Not on file  Other Topics Concern   Not on file  Social History Narrative   Not on file   Social Determinants of Health   Financial Resource Strain: Not on file  Food Insecurity: Not on file  Transportation Needs: Not on file  Physical Activity: Not on file  Stress: Not on file  Social Connections: Not on file  Intimate Partner Violence: Not on file     Current  Outpatient Medications:    Cholecalciferol (VITAMIN D3 PO), Take 1 tablet by mouth daily., Disp: , Rfl:    fenofibrate (TRICOR) 145 MG tablet, TAKE 1 TABLET BY MOUTH EVERY DAY, Disp: 90 tablet, Rfl: 1   ferrous sulfate 325 (65 FE) MG EC tablet, Take 325 mg by mouth daily with breakfast., Disp: , Rfl:    hydrOXYzine (ATARAX) 50 MG tablet, Take 1 tablet (50 mg total) by mouth 3 (three) times daily as needed., Disp: 30 tablet, Rfl: 0   losartan-hydrochlorothiazide (HYZAAR) 100-25 MG tablet, Take 1 tablet by mouth daily., Disp: 90 tablet, Rfl: 3   Multiple Vitamin (MULTIVITAMIN WITH MINERALS) TABS tablet, Take 1 tablet by mouth daily., Disp: , Rfl:    omeprazole (PRILOSEC) 20 MG capsule, Take 1 capsule (20 mg total) by mouth daily before breakfast., Disp: 90 capsule, Rfl: 3   pregabalin (LYRICA) 50 MG capsule, Take 1 capsule (50 mg total) by mouth 3 (three) times daily., Disp: 90 capsule, Rfl: 2   tadalafil (CIALIS) 20 MG tablet, As directed, Disp: 5 tablet, Rfl: 3   triamcinolone cream (KENALOG) 0.5 %, Apply 1 application topically 3 (three) times daily., Disp: 30 g, Rfl: 3   No Known Allergies  ROS Review of Systems  Constitutional: Negative.   HENT: Negative.    Eyes: Negative.   Respiratory: Negative.  Cardiovascular: Negative.   Gastrointestinal: Negative.   Endocrine: Negative.   Genitourinary: Negative.   Musculoskeletal: Negative.   Skin: Negative.   Allergic/Immunologic: Negative.   Neurological: Negative.   Hematological: Negative.   Psychiatric/Behavioral: Negative.    All other systems reviewed and are negative.     Objective:    Physical Exam Vitals reviewed.  Constitutional:      Appearance: Normal appearance.  HENT:     Mouth/Throat:     Mouth: Mucous membranes are moist.  Eyes:     Pupils: Pupils are equal, round, and reactive to light.  Neck:     Vascular: No carotid bruit.  Cardiovascular:     Rate and Rhythm: Normal rate and regular rhythm.      Pulses: Normal pulses.     Heart sounds: Normal heart sounds.  Pulmonary:     Effort: Pulmonary effort is normal.     Breath sounds: Normal breath sounds.  Abdominal:     General: Bowel sounds are normal. There is distension.     Palpations: Abdomen is soft. There is no hepatomegaly, splenomegaly or mass.     Tenderness: There is abdominal tenderness. There is no right CVA tenderness or left CVA tenderness.     Hernia: No hernia is present.  Musculoskeletal:     Cervical back: Neck supple.     Right lower leg: No edema.     Left lower leg: No edema.  Skin:    Findings: No rash.  Neurological:     Mental Status: He is alert and oriented to person, place, and time.     Motor: No weakness.  Psychiatric:        Mood and Affect: Mood normal.        Behavior: Behavior normal.     BP 134/87   Pulse (!) 102   Temp 97.6 F (36.4 C) (Tympanic)   Ht _0  (1.778 m)   Wt 218 lb 4.8 oz (99 kg)   SpO2 98%   BMI 31.32 kg/m  Wt Readings from Last 3 Encounters:  11/18/22 218 lb 4.8 oz (99 kg)  10/15/22 214 lb 6.4 oz (97.3 kg)  10/01/22 218 lb 6.4 oz (99.1 kg)     Health Maintenance Due  Topic Date Due   Medicare Annual Wellness (AWV)  05/19/2021    There are no preventive care reminders to display for this patient.  Lab Results  Component Value Date   TSH 2.00 07/09/2021   Lab Results  Component Value Date   WBC 7.1 10/15/2022   HGB 14.3 10/15/2022   HCT 42.9 10/15/2022   MCV 82.5 10/15/2022   PLT 217 10/15/2022   Lab Results  Component Value Date   NA 135 09/24/2021   K 3.7 09/24/2021   CO2 24 09/24/2021   GLUCOSE 101 (H) 09/24/2021   BUN 16 09/24/2021   CREATININE 1.02 09/24/2021   BILITOT 0.4 07/19/2021   ALKPHOS 59 07/19/2021   AST 21 07/19/2021   ALT 18 07/19/2021   PROT 7.5 07/19/2021   ALBUMIN 4.1 07/19/2021   CALCIUM 9.1 09/24/2021   ANIONGAP 9 09/24/2021   EGFR 72 07/09/2021   Lab Results  Component Value Date   CHOL 173 07/17/2022   Lab  Results  Component Value Date   HDL 41 07/17/2022   Lab Results  Component Value Date   LDLCALC 107 (H) 07/17/2022   Lab Results  Component Value Date   TRIG 140 07/17/2022   Lab Results  Component Value Date   CHOLHDL 4.2 07/17/2022   No results found for: "HGBA1C"    Assessment & Plan:   Problem List Items Addressed This Visit       Cardiovascular and Mediastinum   Essential hypertension     Patient denies any chest pain or shortness of breath there is no history of palpitation or paroxysmal nocturnal dyspnea   patient was advised to follow low-salt low-cholesterol diet    ideally I want to keep systolic blood pressure below 130 mmHg, patient was asked to check blood pressure one times a week and give me a report on that.  Patient will be follow-up in 3 months  or earlier as needed, patient will call me back for any change in the cardiovascular symptoms Patient was advised to buy a book from local bookstore concerning blood pressure and read several chapters  every day.  This will be supplemented by some of the material we will give him from the office.  Patient should also utilize other resources like YouTube and Internet to learn more about the blood pressure and the diet.        Genitourinary   Benign prostatic hyperplasia with urinary hesitancy    Patient has a positive Murphy sign, we will get a ultrasound of the abdomen, patient was advised to avoid fatty food and milk products, IBS diet was discussed        Other   Generalized abdominal pain - Primary   Relevant Orders   US Abdomen Complete    No orders of the defined types were placed in this encounter.   Follow-up: No follow-ups on file.    Cletis Athens, MD

## 2022-11-19 ENCOUNTER — Ambulatory Visit
Admission: RE | Admit: 2022-11-19 | Discharge: 2022-11-19 | Disposition: A | Discharge status: Home / Self Care | Payer: Medicare HMO | Source: Ambulatory Visit | Attending: Internal Medicine | Admitting: Internal Medicine

## 2022-11-19 DIAGNOSIS — R1084 Generalized abdominal pain: Secondary | ICD-10-CM | POA: Diagnosis not present

## 2022-11-19 DIAGNOSIS — R1010 Upper abdominal pain, unspecified: Secondary | ICD-10-CM | POA: Diagnosis not present

## 2022-11-27 ENCOUNTER — Encounter: Payer: Self-pay | Admitting: Internal Medicine

## 2022-11-27 ENCOUNTER — Ambulatory Visit (INDEPENDENT_AMBULATORY_CARE_PROVIDER_SITE_OTHER): Payer: Medicare HMO | Admitting: Internal Medicine

## 2022-11-27 VITALS — HR 87 | Temp 97.6°F | Ht 70.0 in | Wt 220.6 lb

## 2022-11-27 DIAGNOSIS — N529 Male erectile dysfunction, unspecified: Secondary | ICD-10-CM

## 2022-11-27 DIAGNOSIS — G629 Polyneuropathy, unspecified: Secondary | ICD-10-CM | POA: Diagnosis not present

## 2022-11-27 DIAGNOSIS — I10 Essential (primary) hypertension: Secondary | ICD-10-CM

## 2022-11-27 DIAGNOSIS — J301 Allergic rhinitis due to pollen: Secondary | ICD-10-CM | POA: Diagnosis not present

## 2022-11-27 DIAGNOSIS — K2930 Chronic superficial gastritis without bleeding: Secondary | ICD-10-CM | POA: Diagnosis not present

## 2022-11-27 MED ORDER — TRIAMCINOLONE ACETONIDE 40 MG/ML IJ SUSP
40.0000 mg | Freq: Once | INTRAMUSCULAR | Status: AC
  Administered 2022-11-27: 40 mg via INTRA_ARTICULAR

## 2022-11-27 MED ORDER — TADALAFIL 20 MG PO TABS: ORAL_TABLET | ORAL | 3 refills | Status: DC

## 2022-11-27 NOTE — Assessment & Plan Note (Signed)
Take Claritin 10 mg p.o. daily 

## 2022-11-27 NOTE — Assessment & Plan Note (Signed)
Stable

## 2022-11-27 NOTE — Assessment & Plan Note (Signed)

## 2022-11-27 NOTE — Assessment & Plan Note (Signed)
Take Prilosec 20 mg p.o. twice a day

## 2022-11-27 NOTE — Progress Notes (Signed)
Established Patient Office Visit  Subjective:  Patient ID: Zachary Taylor, male    DOB: 07/15/1951  Age: 71 y.o. MRN: [id number]  CC:  Chief Complaint  Patient presents with   Foot Pain    Wants cortisone shot in left heel    Foot Pain    [name] presents for foot and knee pain  Past Medical History:  Diagnosis Date   Hypertension     Past Surgical History:  Procedure Laterality Date   BIOPSY  09/26/2021   Procedure: BIOPSY;  Surgeon: Rogene Houston, MD;  [facility]: [facility];  Service: Endoscopy;;   COLONOSCOPY WITH PROPOFOL N/A 09/26/2021   Procedure: COLONOSCOPY WITH PROPOFOL;  Surgeon: Rogene Houston, MD;  [facility];  Service: Endoscopy;  Laterality: N/A;  2:20   ESOPHAGOGASTRODUODENOSCOPY (EGD) WITH PROPOFOL N/A 09/26/2021   Procedure: ESOPHAGOGASTRODUODENOSCOPY (EGD) WITH PROPOFOL;  Surgeon: Rogene Houston, MD;  [facility];  Service: Endoscopy;  Laterality: N/A;   GIVENS CAPSULE STUDY N/A 04/15/2022   Procedure: GIVENS CAPSULE STUDY;  Surgeon: Rogene Houston, MD;  [facility]: [facility];  Service: Endoscopy;  Laterality: N/A;  730    History reviewed. No pertinent family history.  Social History   Socioeconomic History   Marital status: Married    Spouse name: Not on file   Number of children: Not on file   Years of education: Not on file   Highest education level: Not on file  Occupational History   Not on file  Tobacco Use   Smoking status: Never   Smokeless tobacco: Never  Substance and Sexual Activity   Alcohol use: No   Drug use: No   Sexual activity: Not on file  Other Topics Concern   Not on file  Social History Narrative   Not on file   Social Determinants of Health   Financial Resource Strain: Not on file  Food Insecurity: Not on file  Transportation Needs: Not on file  Physical Activity: Not on file  Stress: Not on file  Social Connections: Not on file  Intimate Partner Violence: Not on file      Current Outpatient Medications:    Cholecalciferol (VITAMIN D3 PO), Take 1 tablet by mouth daily., Disp: , Rfl:    fenofibrate (TRICOR) 145 MG tablet, TAKE 1 TABLET BY MOUTH EVERY DAY, Disp: 90 tablet, Rfl: 1   ferrous sulfate 325 (65 FE) MG EC tablet, Take 325 mg by mouth daily with breakfast., Disp: , Rfl:    hydrOXYzine (ATARAX) 50 MG tablet, Take 1 tablet (50 mg total) by mouth 3 (three) times daily as needed., Disp: 30 tablet, Rfl: 0   losartan-hydrochlorothiazide (HYZAAR) 100-25 MG tablet, Take 1 tablet by mouth daily., Disp: 90 tablet, Rfl: 3   Multiple Vitamin (MULTIVITAMIN WITH MINERALS) TABS tablet, Take 1 tablet by mouth daily., Disp: , Rfl:    omeprazole (PRILOSEC) 20 MG capsule, Take 1 capsule (20 mg total) by mouth daily before breakfast., Disp: 90 capsule, Rfl: 3   pregabalin (LYRICA) 50 MG capsule, Take 1 capsule (50 mg total) by mouth 3 (three) times daily., Disp: 90 capsule, Rfl: 2   triamcinolone cream (KENALOG) 0.5 %, Apply 1 application topically 3 (three) times daily., Disp: 30 g, Rfl: 3   tadalafil (CIALIS) 20 MG tablet, As directed, Disp: 5 tablet, Rfl: 3   No Known Allergies  ROS Review of Systems  Constitutional: Negative.   HENT: Negative.    Eyes: Negative.  Respiratory: Negative.    Cardiovascular: Negative.   Gastrointestinal: Negative.   Endocrine: Negative.   Genitourinary: Negative.   Musculoskeletal: Negative.   Skin: Negative.   Allergic/Immunologic: Negative.   Neurological: Negative.   Hematological: Negative.   Psychiatric/Behavioral: Negative.    All other systems reviewed and are negative.     Objective:    Physical Exam Vitals reviewed.  Constitutional:      Appearance: Normal appearance.  HENT:     Mouth/Throat:     Mouth: Mucous membranes are moist.  Eyes:     Pupils: Pupils are equal, round, and reactive to light.  Neck:     Vascular: No carotid bruit.  Cardiovascular:     Rate and Rhythm: Normal rate and regular  rhythm.     Pulses: Normal pulses.     Heart sounds: Normal heart sounds.  Pulmonary:     Effort: Pulmonary effort is normal.     Breath sounds: Normal breath sounds.  Abdominal:     General: Bowel sounds are normal.     Palpations: Abdomen is soft. There is no hepatomegaly, splenomegaly or mass.     Tenderness: There is no abdominal tenderness.     Hernia: No hernia is present.  Musculoskeletal:     Cervical back: Neck supple.     Right lower leg: No edema.     Left lower leg: No edema.  Skin:    Findings: No rash.  Neurological:     Mental Status: He is alert and oriented to person, place, and time.     Motor: No weakness.  Psychiatric:        Mood and Affect: Mood normal.        Behavior: Behavior normal.     Pulse 87   Temp 97.6 F (36.4 C) (Temporal)   Ht _0  (1.778 m)   Wt 220 lb 9.6 oz (100.1 kg)   SpO2 98%   BMI 31.65 kg/m  Wt Readings from Last 3 Encounters:  11/27/22 220 lb 9.6 oz (100.1 kg)  11/18/22 218 lb 4.8 oz (99 kg)  10/15/22 214 lb 6.4 oz (97.3 kg)     Health Maintenance Due  Topic Date Due   DTaP/Tdap/Td (1 - Tdap) Never done   Medicare Annual Wellness (AWV)  05/19/2021    There are no preventive care reminders to display for this patient.  Lab Results  Component Value Date   TSH 2.00 07/09/2021   Lab Results  Component Value Date   WBC 7.1 10/15/2022   HGB 14.3 10/15/2022   HCT 42.9 10/15/2022   MCV 82.5 10/15/2022   PLT 217 10/15/2022   Lab Results  Component Value Date   NA 135 09/24/2021   K 3.7 09/24/2021   CO2 24 09/24/2021   GLUCOSE 101 (H) 09/24/2021   BUN 16 09/24/2021   CREATININE 1.02 09/24/2021   BILITOT 0.4 07/19/2021   ALKPHOS 59 07/19/2021   AST 21 07/19/2021   ALT 18 07/19/2021   PROT 7.5 07/19/2021   ALBUMIN 4.1 07/19/2021   CALCIUM 9.1 09/24/2021   ANIONGAP 9 09/24/2021   EGFR 72 07/09/2021   Lab Results  Component Value Date   CHOL 173 07/17/2022   Lab Results  Component Value Date   HDL 41  07/17/2022   Lab Results  Component Value Date   LDLCALC 107 (H) 07/17/2022   Lab Results  Component Value Date   TRIG 140 07/17/2022   Lab Results  Component Value Date  CHOLHDL 4.2 07/17/2022   No results found for: "HGBA1C" 1 cc of lidocaine was injected under sterile condition on the heel of the left foot  followed by injection of 1 cc of Kenalog patient pt tolerated the procedure well and he was able to walk out of the office in stable condition   Assessment & Plan:   Problem List Items Addressed This Visit       Cardiovascular and Mediastinum   Essential hypertension - Primary     Patient denies any chest pain or shortness of breath there is no history of palpitation or paroxysmal nocturnal dyspnea   patient was advised to follow low-salt low-cholesterol diet    ideally I want to keep systolic blood pressure below 130 mmHg, patient was asked to check blood pressure one times a week and give me a report on that.  Patient will be follow-up in 3 months  or earlier as needed, patient will call me back for any change in the cardiovascular symptoms Patient was advised to buy a book from local bookstore concerning blood pressure and read several chapters  every day.  This will be supplemented by some of the material we will give him from the office.  Patient should also utilize other resources like YouTube and Internet to learn more about the blood pressure and the diet.      Relevant Medications   tadalafil (CIALIS) 20 MG tablet     Respiratory   Seasonal allergic rhinitis due to pollen    Take Claritin 10 mg p.o. daily        Digestive   Superficial gastritis without hemorrhage    Take Prilosec 20 mg p.o. twice a day        Nervous and Auditory   Neuropathy    Stable        Other   Erectile dysfunction   Relevant Medications   tadalafil (CIALIS) 20 MG tablet    Meds ordered this encounter  Medications   triamcinolone acetonide (KENALOG-40) injection 40  mg   DISCONTD: tadalafil (CIALIS) 20 MG tablet    Sig: As directed    Dispense:  5 tablet    Refill:  3   tadalafil (CIALIS) 20 MG tablet    Sig: As directed    Dispense:  5 tablet    Refill:  3    Follow-up: No follow-ups on file.    Cletis Athens, MD

## 2022-12-09 ENCOUNTER — Telehealth (INDEPENDENT_AMBULATORY_CARE_PROVIDER_SITE_OTHER): Payer: Self-pay

## 2022-12-09 NOTE — Telephone Encounter (Signed)
I spoke with the patient he says he will be here on 12/10/2022 at 2:30 pm.

## 2022-12-09 NOTE — Telephone Encounter (Signed)
I have called and left a vm asked that the patient please return call.  Per Dr. Karilyn Cota call patient and give him an appointment as he has been having issues with RUQ pain, negative [place] and negative LFT.

## 2022-12-10 ENCOUNTER — Ambulatory Visit (INDEPENDENT_AMBULATORY_CARE_PROVIDER_SITE_OTHER): Payer: Medicare HMO | Admitting: Gastroenterology

## 2022-12-10 ENCOUNTER — Encounter (INDEPENDENT_AMBULATORY_CARE_PROVIDER_SITE_OTHER): Payer: Self-pay | Admitting: Gastroenterology

## 2022-12-10 VITALS — BP 138/95 | HR 97 | Temp 97.5°F | Ht 70.0 in | Wt 213.5 lb

## 2022-12-10 DIAGNOSIS — R1011 Right upper quadrant pain: Secondary | ICD-10-CM | POA: Diagnosis not present

## 2022-12-10 MED ORDER — SUCRALFATE 1 G PO TABS: 1.0000 g | ORAL_TABLET | Freq: Four times a day (QID) | ORAL | 1 refills | Status: DC

## 2022-12-10 NOTE — Progress Notes (Unsigned)
Referring Provider: Corky Downs, MD Primary Care Physician:  Corky Downs, MD Primary GI Physician: previously [facility]  Chief Complaint  Patient presents with   Abdominal Pain    Patient here today with concerns of RUQ for the last 1-2 months. He says he had a [place] and it was negative. Denies any nausea, vomiting.   HPI:   Zachary Taylor is a 71 y.o. male with past medical history of HTN   Patient presenting today for RUQ pain.  [place] Abd Complete on 11/19/22 with No cholelithiasis or sonographic evidence for acute cholecystitis. Increased hepatic parenchymal echogenicity suggestive of steatosis.  Last labs with hgb 14.3, TIBC 463, Sat 15%, ferritin 14, relatively the same as prior testing, celiac panel negative in may, Previous workup for IDA as below. He is taking iron pills daily.   Present:  Patient states that he has continued RUQ pain. Pain is intermittent, does not occur daily. Notes that pain is somewhat of a burning but is not severe in nature. States that sometimes eating brings pain on. He is avoiding a lot of trigger foods. Denies nausea or vomiting. Previously having some GERD symptoms, now on omeprazole 20mg  daily, initially taking BID but then was decreased back to daily. Having very little heartburn symptoms at this time. No dysphagia or odynophagia. No constipation or diarrhea. No blood in the stools. No weight loss or changes in appetite. He does note some issues with gas, he is not taking anything for it. Denies belching. Does not use any NSAIDs.    Givens capsule study April 2023: normal Last Colonoscopy:09/26/21 - The entire examined colon is normal. - External hemorrhoids. - No specimens collected Last Endoscopy:10/5/22Normal hypopharynx. - Normal esophagus. - Z-line irregular, 35 cm from the incisors. Biopsied-SSB - 7 cm hiatal hernia. - Gastritis. - Normal duodenal bulb and second portion of the duodenum.  Recommendations:  Repeat TCS 10 years  Past Medical  History:  Diagnosis Date   Hypertension     Past Surgical History:  Procedure Laterality Date   BIOPSY  09/26/2021   Procedure: BIOPSY;  Surgeon: 11/26/2021, MD;  [facility];  Service: Endoscopy;;   COLONOSCOPY WITH PROPOFOL N/A 09/26/2021   Procedure: COLONOSCOPY WITH PROPOFOL;  Surgeon: 11/26/2021, MD;  [facility];  Service: Endoscopy;  Laterality: N/A;  2:20   ESOPHAGOGASTRODUODENOSCOPY (EGD) WITH PROPOFOL N/A 09/26/2021   Procedure: ESOPHAGOGASTRODUODENOSCOPY (EGD) WITH PROPOFOL;  Surgeon: 11/26/2021, MD;  [facility];  Service: Endoscopy;  Laterality: N/A;   GIVENS CAPSULE STUDY N/A 04/15/2022   Procedure: GIVENS CAPSULE STUDY;  Surgeon: 04/17/2022, MD;  [facility];  Service: Endoscopy;  Laterality: N/A;  730    Current Outpatient Medications  Medication Sig Dispense Refill   Cholecalciferol (VITAMIN D3 PO) Take 1 tablet by mouth daily.     fenofibrate (TRICOR) 145 MG tablet TAKE 1 TABLET BY MOUTH EVERY DAY 90 tablet 1   ferrous sulfate 325 (65 FE) MG EC tablet Take 325 mg by mouth daily with breakfast.     losartan-hydrochlorothiazide (HYZAAR) 100-25 MG tablet Take 1 tablet by mouth daily. 90 tablet 3   Multiple Vitamin (MULTIVITAMIN WITH MINERALS) TABS tablet Take 1 tablet by mouth daily.     omeprazole (PRILOSEC) 20 MG capsule Take 1 capsule (20 mg total) by mouth daily before breakfast. 90 capsule 3   OVER THE COUNTER MEDICATION Vit C daily     tadalafil (CIALIS) 20 MG  tablet As directed 5 tablet 3   triamcinolone cream (KENALOG) 0.5 % Apply 1 application topically 3 (three) times daily. 30 g 3   No current facility-administered medications for this visit.    Allergies as of 12/10/2022   (No Known Allergies)    History reviewed. No pertinent family history.  Social History   Socioeconomic History   Marital status: Married    Spouse name: Not on file   Number of children: Not on file   Years of  education: Not on file   Highest education level: Not on file  Occupational History   Not on file  Tobacco Use   Smoking status: Never   Smokeless tobacco: Never  Vaping Use   Vaping Use: Never used  Substance and Sexual Activity   Alcohol use: No   Drug use: No   Sexual activity: Not on file  Other Topics Concern   Not on file  Social History Narrative   Not on file   Social Determinants of Health   Financial Resource Strain: Not on file  Food Insecurity: Not on file  Transportation Needs: Not on file  Physical Activity: Not on file  Stress: Not on file  Social Connections: Not on file    Review of systems General: negative for malaise, night sweats, fever, chills, weight los Neck: Negative for lumps, goiter, pain and significant neck swelling Resp: Negative for cough, wheezing, dyspnea at rest CV: Negative for chest pain, leg swelling, palpitations, orthopnea GI: denies melena, hematochezia, nausea, vomiting, diarrhea, constipation, dysphagia, odyonophagia, early satiety or unintentional weight loss.  MSK: Negative for joint pain or swelling, back pain, and muscle pain. Derm: Negative for itching or rash Psych: Denies depression, anxiety, memory loss, confusion. No homicidal or suicidal ideation.  Heme: Negative for prolonged bleeding, bruising easily, and swollen nodes. Endocrine: Negative for cold or heat intolerance, polyuria, polydipsia and goiter. Neuro: negative for tremor, gait imbalance, syncope and seizures. The remainder of the review of systems is noncontributory.  Physical Exam: BP (!) 138/95 (BP Location: Left Arm, Patient Position: Sitting, Cuff Size: Large)   Pulse 97   Temp (!) 97.5 F (36.4 C) (Temporal)   Ht 5\' 10"  (1.778 m)   Wt 213 lb 8 oz (96.8 kg)   BMI 30.63 kg/m  General:   Alert and oriented. No distress noted. Pleasant and cooperative.  Head:  Normocephalic and atraumatic. Eyes:  Conjuctiva clear without scleral icterus. Mouth:  Oral  mucosa pink and moist. Good dentition. No lesions. Heart: Normal rate and rhythm, s1 and s2 heart sounds present.  Lungs: Clear lung sounds in all lobes. Respirations equal and unlabored. Abdomen:  +BS, soft, non-tender and non-distended. No rebound or guarding. No HSM or masses noted. Derm: No palmar erythema or jaundice Msk:  Symmetrical without gross deformities. Normal posture. Extremities:  Without edema. Neurologic:  Alert and  oriented x4 Psych:  Alert and cooperative. Normal mood and affect.  Invalid input(s): "6 MONTHS"   ASSESSMENT: Zachary Taylor is a 71 y.o. male presenting today    PLAN:  Continue omeprazole 20mg  daily  2. Reflux precautions  3. Rx carafate 1g QID, dissolve tablet in 1/2 oz water, mix and drink slurry  All questions were answered, patient verbalized understanding and is in agreement with plan as outlined above.   Follow up 6 weeks   Tynia Wiers L. 80, MSN, APRN, AGNP-C Adult-Gerontology Nurse Practitioner [facility]

## 2022-12-10 NOTE — Patient Instructions (Signed)
Please continue omeprazole 20mg  once daily  Continue to Avoid greasy, spicy, fried, citrus foods, and be mindful that caffeine, carbonated drinks, chocolate and alcohol can increase reflux symptoms Stay upright 2-3 hours after eating, prior to lying down and avoid eating late in the evenings.  I am sending carafate 1g, as the liquid is not covered well by your insurance I have sent the tablet, you can dissolve this into 1/2 ounce of water, mix and drink the slurry. The medication works best as a liquid. Please call with an update on how your pain is after you take the medication for 2 weeks  Follow up 6 weeks

## 2022-12-30 ENCOUNTER — Encounter: Payer: Self-pay | Admitting: Internal Medicine

## 2022-12-30 ENCOUNTER — Ambulatory Visit (INDEPENDENT_AMBULATORY_CARE_PROVIDER_SITE_OTHER): Payer: PPO | Admitting: Internal Medicine

## 2022-12-30 ENCOUNTER — Encounter: Payer: Self-pay | Admitting: Oncology

## 2022-12-30 VITALS — BP 124/76 | HR 109 | Ht 70.0 in | Wt 220.0 lb

## 2022-12-30 DIAGNOSIS — J301 Allergic rhinitis due to pollen: Secondary | ICD-10-CM | POA: Diagnosis not present

## 2022-12-30 DIAGNOSIS — I10 Essential (primary) hypertension: Secondary | ICD-10-CM

## 2022-12-30 DIAGNOSIS — M722 Plantar fascial fibromatosis: Secondary | ICD-10-CM | POA: Diagnosis not present

## 2022-12-30 DIAGNOSIS — D509 Iron deficiency anemia, unspecified: Secondary | ICD-10-CM

## 2022-12-30 DIAGNOSIS — K2100 Gastro-esophageal reflux disease with esophagitis, without bleeding: Secondary | ICD-10-CM | POA: Diagnosis not present

## 2022-12-30 NOTE — Assessment & Plan Note (Signed)
Left foot injected with 40 mg of Kenalog +1 cc of lidocaine, patient tolerated the procedure well.  Marland Kitchen

## 2022-12-30 NOTE — Progress Notes (Signed)
Established Patient Office Visit  Subjective:  Patient ID: Zachary Taylor, male    DOB: Jan 01, 1951  Age: 72 y.o. MRN: [id number]  CC:  Chief Complaint  Patient presents with   Foot Pain    Left side    HPI  Zachary Taylor presents for foot pain  Past Medical History:  Diagnosis Date   Hypertension     Past Surgical History:  Procedure Laterality Date   BIOPSY  09/26/2021   Procedure: BIOPSY;  Surgeon: Malissa Hippo, MD;  [facility];  Service: Endoscopy;;   COLONOSCOPY WITH PROPOFOL N/A 09/26/2021   Procedure: COLONOSCOPY WITH PROPOFOL;  Surgeon: Malissa Hippo, MD;  [facility];  Service: Endoscopy;  Laterality: N/A;  2:20   ESOPHAGOGASTRODUODENOSCOPY (EGD) WITH PROPOFOL N/A 09/26/2021   Procedure: ESOPHAGOGASTRODUODENOSCOPY (EGD) WITH PROPOFOL;  Surgeon: Malissa Hippo, MD;  [facility];  Service: Endoscopy;  Laterality: N/A;   GIVENS CAPSULE STUDY N/A 04/15/2022   Procedure: GIVENS CAPSULE STUDY;  Surgeon: Malissa Hippo, MD;  [facility];  Service: Endoscopy;  Laterality: N/A;  730    History reviewed. No pertinent family history.  Social History   Socioeconomic History   Marital status: Married    Spouse name: Not on file   Number of children: Not on file   Years of education: Not on file   Highest education level: Not on file  Occupational History   Not on file  Tobacco Use   Smoking status: Never   Smokeless tobacco: Never  Vaping Use   Vaping Use: Never used  Substance and Sexual Activity   Alcohol use: No   Drug use: No   Sexual activity: Not on file  Other Topics Concern   Not on file  Social History Narrative   Not on file   Social Determinants of Health   Financial Resource Strain: Not on file  Food Insecurity: Not on file  Transportation Needs: Not on file  Physical Activity: Not on file  Stress: Not on file  Social Connections: Not on file  Intimate Partner Violence: Not on file      Current Outpatient Medications:    Cholecalciferol (VITAMIN D3 PO), Take 1 tablet by mouth daily., Disp: , Rfl:    fenofibrate (TRICOR) 145 MG tablet, TAKE 1 TABLET BY MOUTH EVERY DAY, Disp: 90 tablet, Rfl: 1   ferrous sulfate 325 (65 FE) MG EC tablet, Take 325 mg by mouth daily with breakfast., Disp: , Rfl:    losartan-hydrochlorothiazide (HYZAAR) 100-25 MG tablet, Take 1 tablet by mouth daily., Disp: 90 tablet, Rfl: 3   Multiple Vitamin (MULTIVITAMIN WITH MINERALS) TABS tablet, Take 1 tablet by mouth daily., Disp: , Rfl:    omeprazole (PRILOSEC) 20 MG capsule, Take 1 capsule (20 mg total) by mouth daily before breakfast., Disp: 90 capsule, Rfl: 3   OVER THE COUNTER MEDICATION, Vit C daily, Disp: , Rfl:    sucralfate (CARAFATE) 1 g tablet, Take 1 tablet (1 g total) by mouth 4 (four) times daily., Disp: 56 tablet, Rfl: 1   tadalafil (CIALIS) 20 MG tablet, As directed, Disp: 5 tablet, Rfl: 3   triamcinolone cream (KENALOG) 0.5 %, Apply 1 application topically 3 (three) times daily., Disp: 30 g, Rfl: 3   No Known Allergies  ROS Review of Systems  Constitutional: Negative.   HENT: Negative.    Eyes: Negative.   Respiratory: Negative.    Cardiovascular: Negative.   Gastrointestinal: Negative.  Endocrine: Negative.   Genitourinary: Negative.   Musculoskeletal: Negative.   Skin: Negative.   Allergic/Immunologic: Negative.   Neurological: Negative.   Hematological: Negative.   Psychiatric/Behavioral: Negative.    All other systems reviewed and are negative.     Objective:    Physical Exam Vitals reviewed.  Constitutional:      Appearance: Normal appearance.  HENT:     Mouth/Throat:     Mouth: Mucous membranes are moist.  Eyes:     Pupils: Pupils are equal, round, and reactive to light.  Neck:     Vascular: No carotid bruit.  Cardiovascular:     Rate and Rhythm: Normal rate and regular rhythm.     Pulses: Normal pulses.     Heart sounds: Normal heart sounds.   Pulmonary:     Effort: Pulmonary effort is normal.     Breath sounds: Normal breath sounds.  Abdominal:     General: Bowel sounds are normal.     Palpations: Abdomen is soft. There is no hepatomegaly, splenomegaly or mass.     Tenderness: There is no abdominal tenderness.     Hernia: No hernia is present.  Musculoskeletal:     Cervical back: Neck supple.     Right lower leg: No edema.     Left lower leg: No edema.     Comments: Lt heel pain  Skin:    Findings: No rash.  Neurological:     Mental Status: He is alert and oriented to person, place, and time.     Motor: No weakness.  Psychiatric:        Mood and Affect: Mood normal.        Behavior: Behavior normal.     BP 124/76   Pulse (!) 109   Ht 5\' 10"  (1.778 m)   Wt 220 lb (99.8 kg)   SpO2 96%   BMI 31.57 kg/m  Wt Readings from Last 3 Encounters:  12/30/22 220 lb (99.8 kg)  12/10/22 213 lb 8 oz (96.8 kg)  11/27/22 220 lb 9.6 oz (100.1 kg)     Health Maintenance Due  Topic Date Due   DTaP/Tdap/Td (1 - Tdap) Never done   Medicare Annual Wellness (AWV)  05/19/2021   COVID-19 Vaccine (6 - 2023-24 season) 08/23/2022    There are no preventive care reminders to display for this patient.  Lab Results  Component Value Date   TSH 2.00 07/09/2021   Lab Results  Component Value Date   WBC 7.1 10/15/2022   HGB 14.3 10/15/2022   HCT 42.9 10/15/2022   MCV 82.5 10/15/2022   PLT 217 10/15/2022   Lab Results  Component Value Date   NA 135 09/24/2021   K 3.7 09/24/2021   CO2 24 09/24/2021   GLUCOSE 101 (H) 09/24/2021   BUN 16 09/24/2021   CREATININE 1.02 09/24/2021   BILITOT 0.4 07/19/2021   ALKPHOS 59 07/19/2021   AST 21 07/19/2021   ALT 18 07/19/2021   PROT 7.5 07/19/2021   ALBUMIN 4.1 07/19/2021   CALCIUM 9.1 09/24/2021   ANIONGAP 9 09/24/2021   EGFR 72 07/09/2021   Lab Results  Component Value Date   CHOL 173 07/17/2022   Lab Results  Component Value Date   HDL 41 07/17/2022   Lab Results   Component Value Date   LDLCALC 107 (H) 07/17/2022   Lab Results  Component Value Date   TRIG 140 07/17/2022   Lab Results  Component Value Date   CHOLHDL 4.2  07/17/2022   No results found for: "HGBA1C"    Assessment & Plan:   Problem List Items Addressed This Visit       Cardiovascular and Mediastinum   Essential hypertension - Primary     Respiratory   Seasonal allergic rhinitis due to pollen    Take Claritin 5 mg p.o. daily        Digestive   Gastroesophageal reflux disease with esophagitis without hemorrhage    Stable at the present time        Musculoskeletal and Integument   Plantar fasciitis    Left foot injected with 40 mg of Kenalog +1 cc of lidocaine, patient tolerated the procedure well.  .        Other   Anemia    Stable at the present time       No orders of the defined types were placed in this encounter.   Follow-up: No follow-ups on file.    Cletis Athens, MD

## 2022-12-30 NOTE — Assessment & Plan Note (Signed)
Stable at the present time. 

## 2022-12-30 NOTE — Assessment & Plan Note (Signed)
Take Claritin 5 mg p.o. daily 

## 2023-02-07 DIAGNOSIS — M722 Plantar fascial fibromatosis: Secondary | ICD-10-CM | POA: Diagnosis not present

## 2023-03-14 ENCOUNTER — Other Ambulatory Visit (INDEPENDENT_AMBULATORY_CARE_PROVIDER_SITE_OTHER): Payer: Self-pay | Admitting: Internal Medicine

## 2023-03-14 DIAGNOSIS — K2930 Chronic superficial gastritis without bleeding: Secondary | ICD-10-CM

## 2023-06-11 ENCOUNTER — Encounter: Payer: Self-pay | Admitting: Oncology

## 2023-06-17 ENCOUNTER — Encounter: Payer: Self-pay | Admitting: Oncology

## 2023-06-19 ENCOUNTER — Encounter: Payer: Self-pay | Admitting: Oncology

## 2023-06-24 ENCOUNTER — Encounter: Payer: Self-pay | Admitting: Oncology

## 2023-06-24 ENCOUNTER — Ambulatory Visit (INDEPENDENT_AMBULATORY_CARE_PROVIDER_SITE_OTHER): Payer: HMO | Admitting: Podiatry

## 2023-06-24 DIAGNOSIS — M722 Plantar fascial fibromatosis: Secondary | ICD-10-CM

## 2023-06-24 MED ORDER — CYCLOBENZAPRINE HCL 10 MG PO TABS: 10.0000 mg | ORAL_TABLET | Freq: Three times a day (TID) | ORAL | 0 refills | Status: DC | PRN

## 2023-06-24 MED ORDER — MELOXICAM 15 MG PO TABS: 15.0000 mg | ORAL_TABLET | Freq: Every day | ORAL | 0 refills | Status: DC

## 2023-06-24 MED ORDER — METHYLPREDNISOLONE 4 MG PO TBPK: ORAL_TABLET | ORAL | 0 refills | Status: DC

## 2023-06-24 NOTE — Progress Notes (Signed)
Subjective:  Patient ID: Zachary Taylor, male    DOB: [birth year],  MRN: [id number]  Chief Complaint  Patient presents with   Foot Pain    Bilateral foot pain     72 y.o. male presents with the above complaint.  Patient presents with left Planter fasciitisLeft pain.  Patient states pain for touch is progressive gotten worse worse with ambulation worse with pressure he states that he is tried all conservative care none of which has helped.  He does not want do injection as he has done in the past.  He has tried the boot.  He wants to discuss other options.   Review of Systems: Negative except as noted in the HPI. Denies N/V/F/Ch.  Past Medical History:  Diagnosis Date   Hypertension     Current Outpatient Medications:    cyclobenzaprine (FLEXERIL) 10 MG tablet, Take 1 tablet (10 mg total) by mouth 3 (three) times daily as needed for muscle spasms., Disp: 30 tablet, Rfl: 0   meloxicam (MOBIC) 15 MG tablet, Take 1 tablet (15 mg total) by mouth daily., Disp: 30 tablet, Rfl: 0   methylPREDNISolone (MEDROL DOSEPAK) 4 MG TBPK tablet, Take as directed, Disp: 21 each, Rfl: 0   Cholecalciferol (VITAMIN D3 PO), Take 1 tablet by mouth daily., Disp: , Rfl:    fenofibrate (TRICOR) 145 MG tablet, TAKE 1 TABLET BY MOUTH EVERY DAY, Disp: 90 tablet, Rfl: 1   ferrous sulfate 325 (65 FE) MG EC tablet, Take 325 mg by mouth daily with breakfast., Disp: , Rfl:    losartan-hydrochlorothiazide (HYZAAR) 100-25 MG tablet, Take 1 tablet by mouth daily., Disp: 90 tablet, Rfl: 3   Multiple Vitamin (MULTIVITAMIN WITH MINERALS) TABS tablet, Take 1 tablet by mouth daily., Disp: , Rfl:    omeprazole (PRILOSEC) 20 MG capsule, TAKE 1 CAPSULE BY MOUTH DAILY BEFORE BREAKFAST., Disp: 90 capsule, Rfl: 3   OVER THE COUNTER MEDICATION, Vit C daily, Disp: , Rfl:    sucralfate (CARAFATE) 1 g tablet, Take 1 tablet (1 g total) by mouth 4 (four) times daily., Disp: 56 tablet, Rfl: 1   tadalafil (CIALIS) 20 MG tablet, As directed, Disp:  5 tablet, Rfl: 3   triamcinolone cream (KENALOG) 0.5 %, Apply 1 application topically 3 (three) times daily., Disp: 30 g, Rfl: 3  Social History   Tobacco Use  Smoking Status Never  Smokeless Tobacco Never    No Known Allergies Objective:  There were no vitals filed for this visit. There is no height or weight on file to calculate BMI. Constitutional Well developed. Well nourished.  Vascular Dorsalis pedis pulses palpable bilaterally. Posterior tibial pulses palpable bilaterally. Capillary refill normal to all digits.  No cyanosis or clubbing noted. Pedal hair growth normal.  Neurologic Normal speech. Oriented to person, place, and time. Epicritic sensation to light touch grossly present bilaterally.  Dermatologic Nails well groomed and normal in appearance. No open wounds. No skin lesions.  Orthopedic: Normal joint ROM without pain or crepitus bilaterally. No visible deformities. Tender to palpation at the calcaneal tuber left. No pain with calcaneal squeeze left. Ankle ROM diminished range of motion left. Silfverskiold Test: positive left.   Radiographs: T none  Assessment:   1. Plantar fasciitis of left foot    Plan:  Patient was evaluated and treated and all questions answered.  Plantar Fasciitis, left - XR reviewed as above.  - Educated on icing and stretching. Instructions given.  -Hold off on injection as they have not helped in the past. -  DME: Plantar fascial brace dispensed to support the medial longitudinal arch of the foot and offload pressure from the heel and prevent arch collapse during weightbearing - Pharmacologic management: None   No follow-ups on file.

## 2023-07-22 ENCOUNTER — Ambulatory Visit (INDEPENDENT_AMBULATORY_CARE_PROVIDER_SITE_OTHER): Payer: HMO | Admitting: Podiatry

## 2023-07-22 DIAGNOSIS — M19072 Primary osteoarthritis, left ankle and foot: Secondary | ICD-10-CM | POA: Diagnosis not present

## 2023-07-22 DIAGNOSIS — M778 Other enthesopathies, not elsewhere classified: Secondary | ICD-10-CM | POA: Diagnosis not present

## 2023-07-22 MED ORDER — MELOXICAM 15 MG PO TABS: 15.0000 mg | ORAL_TABLET | Freq: Every day | ORAL | 1 refills | Status: AC

## 2023-07-22 MED ORDER — CYCLOBENZAPRINE HCL 10 MG PO TABS: 10.0000 mg | ORAL_TABLET | Freq: Three times a day (TID) | ORAL | 2 refills | Status: DC | PRN

## 2023-07-22 NOTE — Progress Notes (Signed)
Subjective:  Patient ID: Zachary Taylor, male    DOB: [birth year],  MRN: [id number]  Chief Complaint  Patient presents with   Plantar Fasciitis    72 y.o. male presents with the above complaint.  Patient was noticed with follow-up of Planter fasciitis he states is doing a lot better.  Denies any other complaint left dorsal midfoot pain has been on for quite some time and started to hurt him.  He would like to do a steroid shot.   Review of Systems: Negative except as noted in the HPI. Denies N/V/F/Ch.  Past Medical History:  Diagnosis Date   Hypertension     Current Outpatient Medications:    Cholecalciferol (VITAMIN D3 PO), Take 1 tablet by mouth daily., Disp: , Rfl:    cyclobenzaprine (FLEXERIL) 10 MG tablet, Take 1 tablet (10 mg total) by mouth 3 (three) times daily as needed for muscle spasms., Disp: 30 tablet, Rfl: 0   cyclobenzaprine (FLEXERIL) 10 MG tablet, Take 1 tablet (10 mg total) by mouth 3 (three) times daily as needed for muscle spasms., Disp: 30 tablet, Rfl: 2   fenofibrate (TRICOR) 145 MG tablet, TAKE 1 TABLET BY MOUTH EVERY DAY, Disp: 90 tablet, Rfl: 1   ferrous sulfate 325 (65 FE) MG EC tablet, Take 325 mg by mouth daily with breakfast., Disp: , Rfl:    losartan-hydrochlorothiazide (HYZAAR) 100-25 MG tablet, Take 1 tablet by mouth daily., Disp: 90 tablet, Rfl: 3   meloxicam (MOBIC) 15 MG tablet, Take 1 tablet (15 mg total) by mouth daily., Disp: 30 tablet, Rfl: 1   methylPREDNISolone (MEDROL DOSEPAK) 4 MG TBPK tablet, Take as directed, Disp: 21 each, Rfl: 0   Multiple Vitamin (MULTIVITAMIN WITH MINERALS) TABS tablet, Take 1 tablet by mouth daily., Disp: , Rfl:    omeprazole (PRILOSEC) 20 MG capsule, TAKE 1 CAPSULE BY MOUTH DAILY BEFORE BREAKFAST., Disp: 90 capsule, Rfl: 3   OVER THE COUNTER MEDICATION, Vit C daily, Disp: , Rfl:    sucralfate (CARAFATE) 1 g tablet, Take 1 tablet (1 g total) by mouth 4 (four) times daily., Disp: 56 tablet, Rfl: 1   tadalafil (CIALIS) 20 MG  tablet, As directed, Disp: 5 tablet, Rfl: 3   triamcinolone cream (KENALOG) 0.5 %, Apply 1 application topically 3 (three) times daily., Disp: 30 g, Rfl: 3  Social History   Tobacco Use  Smoking Status Never  Smokeless Tobacco Never    No Known Allergies Objective:  There were no vitals filed for this visit. There is no height or weight on file to calculate BMI. Constitutional Well developed. Well nourished.  Vascular Dorsalis pedis pulses palpable bilaterally. Posterior tibial pulses palpable bilaterally. Capillary refill normal to all digits.  No cyanosis or clubbing noted. Pedal hair growth normal.  Neurologic Normal speech. Oriented to person, place, and time. Epicritic sensation to light touch grossly present bilaterally.  Dermatologic Nails well groomed and normal in appearance. No open wounds. No skin lesions.  Orthopedic: Normal joint ROM without pain or crepitus bilaterally. No visible deformities. No further tender to palpation at the calcaneal tuber left. No pain with calcaneal squeeze left. Ankle ROM diminished range of motion left. Silfverskiold Test: positive left.  Dorsal midfoot capsulitis pain on palpation to the midfoot.  Lisfranc interval is intact.  Negative extensor tendinitis   Radiographs: T none  Assessment:   1. Capsulitis of left foot   2. Arthritis of left midfoot     Plan:  Patient was evaluated and treated and all questions answered.  Left dorsal midfoot capsulitis with underlying arthritis -The patient the etiology of capsulitis and regimen options were discussed given the amount of pain that he is having he will benefit from steroid injection at decreasing inflammatory component surgical pain.  Patient agrees with plan to proceed with steroid injection -A steroid injection was performed at left dorsal midfoot using 1% plain Lidocaine and 10 mg of Kenalog. This was well tolerated. -Flexeril and meloxicam was sent to the  pharmacy  Plantar Fasciitis, left -Clinically healed   No follow-ups on file.

## 2023-08-19 ENCOUNTER — Ambulatory Visit (INDEPENDENT_AMBULATORY_CARE_PROVIDER_SITE_OTHER): Payer: HMO | Admitting: Podiatry

## 2023-08-19 DIAGNOSIS — M778 Other enthesopathies, not elsewhere classified: Secondary | ICD-10-CM

## 2023-08-19 NOTE — Patient Instructions (Signed)

## 2023-08-19 NOTE — Progress Notes (Signed)
Subjective:  Patient ID: Zachary Taylor, male    DOB: 1951-05-27,  MRN: [id number]  Chief Complaint  Patient presents with   Foot Pain    72 y.o. male presents with the above complaint.  Patient presents for follow-up of left midfoot pain.  He states is doing a lot better denies any other acute complaints.   Review of Systems: Negative except as noted in the HPI. Denies N/V/F/Ch.  Past Medical History:  Diagnosis Date   Hypertension     Current Outpatient Medications:    Cholecalciferol (VITAMIN D3 PO), Take 1 tablet by mouth daily., Disp: , Rfl:    cyclobenzaprine (FLEXERIL) 10 MG tablet, Take 1 tablet (10 mg total) by mouth 3 (three) times daily as needed for muscle spasms., Disp: 30 tablet, Rfl: 0   cyclobenzaprine (FLEXERIL) 10 MG tablet, Take 1 tablet (10 mg total) by mouth 3 (three) times daily as needed for muscle spasms., Disp: 30 tablet, Rfl: 2   fenofibrate (TRICOR) 145 MG tablet, TAKE 1 TABLET BY MOUTH EVERY DAY, Disp: 90 tablet, Rfl: 1   ferrous sulfate 325 (65 FE) MG EC tablet, Take 325 mg by mouth daily with breakfast., Disp: , Rfl:    losartan-hydrochlorothiazide (HYZAAR) 100-25 MG tablet, Take 1 tablet by mouth daily., Disp: 90 tablet, Rfl: 3   meloxicam (MOBIC) 15 MG tablet, Take 1 tablet (15 mg total) by mouth daily., Disp: 30 tablet, Rfl: 1   methylPREDNISolone (MEDROL DOSEPAK) 4 MG TBPK tablet, Take as directed, Disp: 21 each, Rfl: 0   Multiple Vitamin (MULTIVITAMIN WITH MINERALS) TABS tablet, Take 1 tablet by mouth daily., Disp: , Rfl:    omeprazole (PRILOSEC) 20 MG capsule, TAKE 1 CAPSULE BY MOUTH DAILY BEFORE BREAKFAST., Disp: 90 capsule, Rfl: 3   OVER THE COUNTER MEDICATION, Vit C daily, Disp: , Rfl:    sucralfate (CARAFATE) 1 g tablet, Take 1 tablet (1 g total) by mouth 4 (four) times daily., Disp: 56 tablet, Rfl: 1   tadalafil (CIALIS) 20 MG tablet, As directed, Disp: 5 tablet, Rfl: 3   triamcinolone cream (KENALOG) 0.5 %, Apply 1 application topically 3 (three)  times daily., Disp: 30 g, Rfl: 3  Social History   Tobacco Use  Smoking Status Never  Smokeless Tobacco Never    No Known Allergies Objective:  There were no vitals filed for this visit. There is no height or weight on file to calculate BMI. Constitutional Well developed. Well nourished.  Vascular Dorsalis pedis pulses palpable bilaterally. Posterior tibial pulses palpable bilaterally. Capillary refill normal to all digits.  No cyanosis or clubbing noted. Pedal hair growth normal.  Neurologic Normal speech. Oriented to person, place, and time. Epicritic sensation to light touch grossly present bilaterally.  Dermatologic Nails well groomed and normal in appearance. No open wounds. No skin lesions.  Orthopedic: Normal joint ROM without pain or crepitus bilaterally. No visible deformities. No further tender to palpation at the calcaneal tuber left. No pain with calcaneal squeeze left. Ankle ROM diminished range of motion left. Silfverskiold Test: positive left.  No further dorsal midfoot capsulitis pain on palpation to the midfoot.  Lisfranc interval is intact.  Negative extensor tendinitis   Radiographs: T none  Assessment:   No diagnosis found.   Plan:  Patient was evaluated and treated and all questions answered.  Left dorsal midfoot capsulitis with underlying arthritis -Clinically healed and completely resolve the pain.  I discussed shoe gear modification and stretching exercises.  Stretching instructions were given.  Plantar Fasciitis, left -  Clinically healed   No follow-ups on file.

## 2024-04-16 ENCOUNTER — Encounter: Payer: Self-pay | Admitting: Oncology

## 2024-05-11 ENCOUNTER — Ambulatory Visit: Admitting: Internal Medicine

## 2024-05-25 ENCOUNTER — Encounter: Payer: Self-pay | Admitting: Internal Medicine

## 2024-05-25 ENCOUNTER — Ambulatory Visit (INDEPENDENT_AMBULATORY_CARE_PROVIDER_SITE_OTHER): Admitting: Internal Medicine

## 2024-05-25 VITALS — BP 180/110 | HR 87 | Temp 97.9°F | Ht 70.0 in | Wt 217.6 lb

## 2024-05-25 DIAGNOSIS — I10 Essential (primary) hypertension: Secondary | ICD-10-CM

## 2024-05-25 DIAGNOSIS — M5416 Radiculopathy, lumbar region: Secondary | ICD-10-CM | POA: Diagnosis not present

## 2024-05-25 MED ORDER — MELOXICAM 15 MG PO TABS
15.0000 mg | ORAL_TABLET | Freq: Every day | ORAL | 0 refills | Status: DC
Start: 2024-05-25 — End: 2024-06-18

## 2024-05-25 MED ORDER — AMLODIPINE-OLMESARTAN 5-40 MG PO TABS: 1.0000 | ORAL_TABLET | Freq: Every day | ORAL | 0 refills | Status: DC

## 2024-05-25 MED ORDER — GABAPENTIN 300 MG PO CAPS: 300.0000 mg | ORAL_CAPSULE | Freq: Three times a day (TID) | ORAL | 2 refills | Status: DC

## 2024-05-25 NOTE — Progress Notes (Signed)
 Established Patient Office Visit  Subjective:  Patient ID: Zachary Taylor, male    DOB: 04-13-1951  Age: 73 y.o. MRN: [id number]  Chief Complaint  Patient presents with   New Patient (Initial Visit)    Regular checkup, left leg issues, trouble breathing in the morning     Here to transfer IM care from Dr Loetta Ringer. PMH of HTN. C/o pain in left ankle, hip and back pain which is unrelieved by flexeril .    No other concerns at this time.   Past Medical History:  Diagnosis Date   Hypertension     Past Surgical History:  Procedure Laterality Date   BIOPSY  09/26/2021   Procedure: BIOPSY;  Surgeon: Ruby Corporal, MD;  [facility];  Service: Endoscopy;;   COLONOSCOPY WITH PROPOFOL  N/A 09/26/2021   Procedure: COLONOSCOPY WITH PROPOFOL ;  Surgeon: Ruby Corporal, MD;  [facility];  Service: Endoscopy;  Laterality: N/A;  2:20   ESOPHAGOGASTRODUODENOSCOPY (EGD) WITH PROPOFOL  N/A 09/26/2021   Procedure: ESOPHAGOGASTRODUODENOSCOPY (EGD) WITH PROPOFOL ;  Surgeon: Ruby Corporal, MD;  [facility];  Service: Endoscopy;  Laterality: N/A;   GIVENS CAPSULE STUDY N/A 04/15/2022   Procedure: GIVENS CAPSULE STUDY;  Surgeon: Ruby Corporal, MD;  [facility];  Service: Endoscopy;  Laterality: N/A;  730    Social History   Socioeconomic History   Marital status: Married    Spouse name: Not on file   Number of children: Not on file   Years of education: Not on file   Highest education level: Not on file  Occupational History   Not on file  Tobacco Use   Smoking status: Never   Smokeless tobacco: Never  Vaping Use   Vaping status: Never Used  Substance and Sexual Activity   Alcohol use: No   Drug use: No   Sexual activity: Not on file  Other Topics Concern   Not on file  Social History Narrative   Not on file   Social Drivers of Health   Financial Resource Strain: Not on file  Food Insecurity: Not on file  Transportation Needs: Not on  file  Physical Activity: Not on file  Stress: Not on file  Social Connections: Not on file  Intimate Partner Violence: Not on file    No family history on file.  No Known Allergies  Outpatient Medications Prior to Visit  Medication Sig   Cholecalciferol (VITAMIN D3 PO) Take 1 tablet by mouth daily.   ferrous sulfate 325 (65 FE) MG EC tablet Take 325 mg by mouth daily with breakfast.   [DISCONTINUED] gabapentin  (NEURONTIN ) 100 MG capsule 100 mg.   Multiple Vitamin (MULTIVITAMIN WITH MINERALS) TABS tablet Take 1 tablet by mouth daily. (Patient not taking: Reported on 05/25/2024)   omeprazole  (PRILOSEC) 20 MG capsule TAKE 1 CAPSULE BY MOUTH DAILY BEFORE BREAKFAST. (Patient not taking: Reported on 05/25/2024)   OVER THE COUNTER MEDICATION Vit C daily (Patient not taking: Reported on 05/25/2024)   tadalafil  (CIALIS ) 20 MG tablet As directed (Patient not taking: Reported on 05/25/2024)   triamcinolone  cream (KENALOG ) 0.5 % Apply 1 application topically 3 (three) times daily. (Patient not taking: Reported on 05/25/2024)   [DISCONTINUED] cyclobenzaprine  (FLEXERIL ) 10 MG tablet Take 1 tablet (10 mg total) by mouth 3 (three) times daily as needed for muscle spasms. (Patient not taking: Reported on 05/25/2024)   [DISCONTINUED] cyclobenzaprine  (FLEXERIL ) 10 MG tablet Take 1 tablet (10 mg total) by mouth 3 (three) times  daily as needed for muscle spasms. (Patient not taking: Reported on 05/25/2024)   [DISCONTINUED] fenofibrate  (TRICOR ) 145 MG tablet TAKE 1 TABLET BY MOUTH EVERY DAY (Patient not taking: Reported on 05/25/2024)   [DISCONTINUED] losartan -hydrochlorothiazide (HYZAAR) 100-25 MG tablet Take 1 tablet by mouth daily. (Patient not taking: Reported on 05/25/2024)   [DISCONTINUED] methylPREDNISolone  (MEDROL  DOSEPAK) 4 MG TBPK tablet Take as directed (Patient not taking: Reported on 05/25/2024)   [DISCONTINUED] sucralfate  (CARAFATE ) 1 g tablet Take 1 tablet (1 g total) by mouth 4 (four) times daily. (Patient not  taking: Reported on 05/25/2024)   No facility-administered medications prior to visit.    Review of Systems  Constitutional: Negative.   HENT: Negative.    Eyes: Negative.   Respiratory: Negative.    Cardiovascular: Negative.   Gastrointestinal: Negative.   Genitourinary: Negative.   Skin: Negative.   Neurological: Negative.   Endo/Heme/Allergies: Negative.        Objective:   BP (!) 180/110 (Cuff Size: Normal)   Pulse 87   Temp 97.9 F (36.6 C)   Ht 5\' 10"  (1.778 m)   Wt 217 lb 9.6 oz (98.7 kg)   SpO2 97%   BMI 31.22 kg/m   Vitals:   05/25/24 1413 05/25/24 1455  BP: (!) 140/110 (!) 180/110  Pulse: 87   Temp: 97.9 F (36.6 C)   Height: 5\' 10"  (1.778 m)   Weight: 217 lb 9.6 oz (98.7 kg)   SpO2: 97%   BMI (Calculated): 31.22     Physical Exam Vitals reviewed.  Constitutional:      Appearance: Normal appearance. He is obese.  HENT:     Head: Normocephalic.     Left Ear: There is no impacted cerumen.     Nose: Nose normal.     Mouth/Throat:     Mouth: Mucous membranes are moist.     Pharynx: No posterior oropharyngeal erythema.  Eyes:     Extraocular Movements: Extraocular movements intact.     Pupils: Pupils are equal, round, and reactive to light.  Cardiovascular:     Rate and Rhythm: Regular rhythm.     Chest Wall: PMI is not displaced.     Pulses: Normal pulses.     Heart sounds: Normal heart sounds. No murmur heard. Pulmonary:     Effort: Pulmonary effort is normal.     Breath sounds: Normal air entry. No rhonchi or rales.  Abdominal:     General: Abdomen is flat. Bowel sounds are normal. There is no distension.     Palpations: Abdomen is soft. There is no hepatomegaly, splenomegaly or mass.     Tenderness: There is no abdominal tenderness.  Musculoskeletal:        General: Normal range of motion.     Cervical back: Normal range of motion and neck supple.     Right lower leg: No edema.     Left lower leg: No edema.  Skin:    General: Skin is  warm and dry.  Neurological:     General: No focal deficit present.     Mental Status: He is alert and oriented to person, place, and time.     Cranial Nerves: No cranial nerve deficit.     Motor: No weakness.  Psychiatric:        Mood and Affect: Mood normal.        Behavior: Behavior normal.      No results found for any visits on 05/25/24.  No results found for this  or any previous visit (from the past 2160 hours).    Assessment & Plan:  As per problem list. Low salt diet. Problem List Items Addressed This Visit       Cardiovascular and Mediastinum   Essential hypertension - Primary   Relevant Medications   amLODipine-olmesartan (AZOR) 5-40 MG tablet   Other Relevant Orders   CBC With Diff/Platelet   Comprehensive metabolic panel with GFR   Lipid panel   Other Visit Diagnoses       Lumbar radiculopathy       Relevant Medications   gabapentin  (NEURONTIN ) 300 MG capsule   meloxicam  (MOBIC ) 15 MG tablet       Return in about 2 weeks (around 06/08/2024) for awv with labs prior.   Total time spent: 45 minutes  Arzella Bitters, MD  05/25/2024   This document may have been prepared by [employer] [facility] Voice Recognition software and as such may include unintentional dictation errors.

## 2024-05-29 ENCOUNTER — Other Ambulatory Visit (INDEPENDENT_AMBULATORY_CARE_PROVIDER_SITE_OTHER): Payer: Self-pay | Admitting: Gastroenterology

## 2024-05-29 DIAGNOSIS — K2930 Chronic superficial gastritis without bleeding: Secondary | ICD-10-CM

## 2024-05-31 NOTE — Telephone Encounter (Signed)
 Last seen 12/10/2022. Needs office visit.

## 2024-06-07 ENCOUNTER — Encounter: Payer: Self-pay | Admitting: Oncology

## 2024-06-08 ENCOUNTER — Encounter: Payer: Self-pay | Admitting: Internal Medicine

## 2024-06-08 ENCOUNTER — Encounter (INDEPENDENT_AMBULATORY_CARE_PROVIDER_SITE_OTHER): Payer: Self-pay | Admitting: Gastroenterology

## 2024-06-08 ENCOUNTER — Ambulatory Visit: Admitting: Internal Medicine

## 2024-06-08 ENCOUNTER — Encounter: Payer: Self-pay | Admitting: Oncology

## 2024-06-08 ENCOUNTER — Encounter: Payer: Self-pay | Admitting: *Deleted

## 2024-06-08 ENCOUNTER — Ambulatory Visit (INDEPENDENT_AMBULATORY_CARE_PROVIDER_SITE_OTHER): Admitting: Gastroenterology

## 2024-06-08 ENCOUNTER — Ambulatory Visit (INDEPENDENT_AMBULATORY_CARE_PROVIDER_SITE_OTHER): Admitting: Internal Medicine

## 2024-06-08 VITALS — BP 117/78 | HR 91 | Temp 98.5°F | Ht 70.0 in | Wt 215.0 lb

## 2024-06-08 VITALS — BP 126/83 | HR 83 | Temp 98.0°F | Ht 70.0 in | Wt 214.6 lb

## 2024-06-08 DIAGNOSIS — I10 Essential (primary) hypertension: Secondary | ICD-10-CM

## 2024-06-08 DIAGNOSIS — Z1331 Encounter for screening for depression: Secondary | ICD-10-CM

## 2024-06-08 DIAGNOSIS — K2930 Chronic superficial gastritis without bleeding: Secondary | ICD-10-CM

## 2024-06-08 DIAGNOSIS — K5900 Constipation, unspecified: Secondary | ICD-10-CM

## 2024-06-08 DIAGNOSIS — Z8719 Personal history of other diseases of the digestive system: Secondary | ICD-10-CM

## 2024-06-08 DIAGNOSIS — K5904 Chronic idiopathic constipation: Secondary | ICD-10-CM | POA: Insufficient documentation

## 2024-06-08 DIAGNOSIS — Z0001 Encounter for general adult medical examination with abnormal findings: Secondary | ICD-10-CM | POA: Diagnosis not present

## 2024-06-08 DIAGNOSIS — K760 Fatty (change of) liver, not elsewhere classified: Secondary | ICD-10-CM | POA: Diagnosis not present

## 2024-06-08 DIAGNOSIS — M5416 Radiculopathy, lumbar region: Secondary | ICD-10-CM

## 2024-06-08 DIAGNOSIS — K2270 Barrett's esophagus without dysplasia: Secondary | ICD-10-CM | POA: Insufficient documentation

## 2024-06-08 DIAGNOSIS — E611 Iron deficiency: Secondary | ICD-10-CM

## 2024-06-08 DIAGNOSIS — D509 Iron deficiency anemia, unspecified: Secondary | ICD-10-CM | POA: Diagnosis not present

## 2024-06-08 DIAGNOSIS — H1011 Acute atopic conjunctivitis, right eye: Secondary | ICD-10-CM

## 2024-06-08 DIAGNOSIS — D500 Iron deficiency anemia secondary to blood loss (chronic): Secondary | ICD-10-CM

## 2024-06-08 DIAGNOSIS — N528 Other male erectile dysfunction: Secondary | ICD-10-CM

## 2024-06-08 DIAGNOSIS — K219 Gastro-esophageal reflux disease without esophagitis: Secondary | ICD-10-CM | POA: Diagnosis not present

## 2024-06-08 MED ORDER — OLOPATADINE HCL 0.2 % OP SOLN
1.0000 [drp] | Freq: Four times a day (QID) | OPHTHALMIC | 0 refills | Status: AC
Start: 2024-06-08 — End: 2024-06-21

## 2024-06-08 MED ORDER — AMLODIPINE-OLMESARTAN 5-40 MG PO TABS
1.0000 | ORAL_TABLET | Freq: Every day | ORAL | 0 refills | Status: DC
Start: 2024-06-08 — End: 2024-09-08

## 2024-06-08 MED ORDER — TADALAFIL 20 MG PO TABS
20.0000 mg | ORAL_TABLET | Freq: Every day | ORAL | 2 refills | Status: DC | PRN
Start: 2024-06-08 — End: 2024-09-08

## 2024-06-08 MED ORDER — OMEPRAZOLE 40 MG PO CPDR: 40.0000 mg | DELAYED_RELEASE_CAPSULE | Freq: Every day | ORAL | 1 refills | Status: DC

## 2024-06-08 NOTE — H&P (View-Only) (Signed)
 Hilaria Titsworth Faizan Cainen Burnham , M.D. Gastroenterology & Hepatology [facility] [address] [place], [place] [address] Primary Care Physician: Shari Daughters, MD [address] [place] [address]  Chief Complaint:  GERD complicated with Barrett's esophagus, hepatic steatosis and chronic iron  deficiency  History of Present Illness:  Zachary Taylor is a 73 y.o. male with history of hypertension, iron  deficiency anemia who presents for evaluation of GERD complicated with Barrett's esophagus, hepatic steatosis and chronic iron  deficiency  Patient was last seen by [facility] December 2023 and is here for follow-up  Patient reports that after stopping PPI the symptoms of reflux have relapsed.  Has intermittent constipation Bristol stool scale bowel movement 2-3 with occasional straining .The patient denies having any nausea, vomiting, fever, chills, hematochezia, melena, hematemesis, abdominal distention, abdominal pain, diarrhea, jaundice, pruritus or weight loss.  Last labs from 09/2022 ferritin 14 TIBC 463 iron  saturation 15  Hemoglobin 14.3  Givens capsule study April 2023: normal Last Colonoscopy:09/26/21 - The entire examined colon is normal. - External hemorrhoids. - No specimens collected.  Suggest repeat 10 years Last Endoscopy:10/5/22Normal hypopharynx. - Normal esophagus. - Z-line irregular, 35 cm from the incisors. Biopsied-SSB - 7 cm hiatal hernia. - Gastritis. - Normal duodenal bulb and second portion of the duodenum.   Recommendations:  Repeat TCS 10 years  Path : A. GE JUNCTION, BIOPSY:  - Esophageal squamous and cardiac mucosa with intestinal metaplasia. See  comment  - Negative for dysplasia   COMMENT:   The findings are consistent with Barretts esophagus in an appropriate  clinical setting.   Past Medical History: Past Medical History:  Diagnosis Date   Hypertension     Past Surgical History: Past Surgical  History:  Procedure Laterality Date   BIOPSY  09/26/2021   Procedure: BIOPSY;  Surgeon: Ruby Corporal, MD;  [facility];  Service: Endoscopy;;   COLONOSCOPY WITH PROPOFOL  N/A 09/26/2021   Procedure: COLONOSCOPY WITH PROPOFOL ;  Surgeon: Ruby Corporal, MD;  [facility];  Service: Endoscopy;  Laterality: N/A;  2:20   ESOPHAGOGASTRODUODENOSCOPY (EGD) WITH PROPOFOL  N/A 09/26/2021   Procedure: ESOPHAGOGASTRODUODENOSCOPY (EGD) WITH PROPOFOL ;  Surgeon: Ruby Corporal, MD;  [facility];  Service: Endoscopy;  Laterality: N/A;   GIVENS CAPSULE STUDY N/A 04/15/2022   Procedure: GIVENS CAPSULE STUDY;  Surgeon: Ruby Corporal, MD;  [facility];  Service: Endoscopy;  Laterality: N/A;  730    Family History:History reviewed. No pertinent family history.  Social History: Social History   Tobacco Use  Smoking Status Never  Smokeless Tobacco Never   Social History   Substance and Sexual Activity  Alcohol Use No   Social History   Substance and Sexual Activity  Drug Use No    Allergies: No Known Allergies  Medications: Current Outpatient Medications  Medication Sig Dispense Refill   amLODipine -olmesartan  (AZOR ) 5-40 MG tablet Take 1 tablet by mouth daily. 30 tablet 0   ferrous sulfate 325 (65 FE) MG EC tablet Take 325 mg by mouth daily with breakfast.     gabapentin  (NEURONTIN ) 300 MG capsule Take 1 capsule (300 mg total) by mouth 3 (three) times daily. 90 capsule 2   meloxicam  (MOBIC ) 15 MG tablet Take 1 tablet (15 mg total) by mouth daily. 30 tablet 0   Pyridoxine HCl (B-6 PO) Take by mouth.     omeprazole  (PRILOSEC) 40 MG capsule Take 1 capsule (40 mg total) by mouth daily. 90 capsule 1  No current facility-administered medications for this visit.    Review of Systems: GENERAL: negative for malaise, night sweats HEENT: No changes in hearing or vision, no nose bleeds or other nasal problems. NECK: Negative for lumps, goiter, pain  and significant neck swelling RESPIRATORY: Negative for cough, wheezing CARDIOVASCULAR: Negative for chest pain, leg swelling, palpitations, orthopnea GI: SEE HPI MUSCULOSKELETAL: Negative for joint pain or swelling, back pain, and muscle pain. SKIN: Negative for lesions, rash HEMATOLOGY Negative for prolonged bleeding, bruising easily, and swollen nodes. ENDOCRINE: Negative for cold or heat intolerance, polyuria, polydipsia and goiter. NEURO: negative for tremor, gait imbalance, syncope and seizures. The remainder of the review of systems is noncontributory.   Physical Exam: BP 126/83   Pulse 83   Temp 98 F (36.7 C)   Ht 5' 10 (1.778 m)   Wt 214 lb 9.6 oz (97.3 kg)   BMI 30.79 kg/m  GENERAL: The patient is AO x3, in no acute distress. HEENT: Head is normocephalic and atraumatic. EOMI are intact. Mouth is well hydrated and without lesions. NECK: Supple. No masses LUNGS: Clear to auscultation. No presence of rhonchi/wheezing/rales. Adequate chest expansion HEART: RRR, normal s1 and s2. ABDOMEN: Soft, nontender, no guarding, no peritoneal signs, and nondistended. BS +. No masses.  Imaging/Labs: as above     Latest Ref Rng & Units 10/15/2022    4:12 PM 05/15/2022   11:13 AM 03/05/2022    9:32 AM  CBC  WBC 3.8 - 10.8 Thousand/uL 7.1  5.6  5.2   Hemoglobin 13.2 - 17.1 g/dL 01.0  27.2  53.6   Hematocrit 38.5 - 50.0 % 42.9  44.6  48.5   Platelets 140 - 400 Thousand/uL 217  242  220    Lab Results  Component Value Date   IRON  68 10/15/2022   TIBC 463 (H) 10/15/2022   FERRITIN 14 (L) 10/15/2022    I personally reviewed and interpreted the available labs, imaging and endoscopic files.  #Ultrasound Narrative & Impression  CLINICAL DATA:  Upper abdominal pain, intermittent.   EXAM: ABDOMEN ULTRASOUND COMPLETE   COMPARISON:  None Available.   FINDINGS: Gallbladder: No gallstones or wall thickening visualized. No sonographic Murphy sign noted by sonographer.   Common  bile duct: Diameter: 3.9 mm   Liver: Increased hepatic parenchymal echogenicity. No focal lesion. Portal vein is patent on color Doppler imaging with normal direction of blood flow towards the liver.   IVC: No abnormality visualized.   Pancreas: Visualized portion unremarkable.   Spleen: Size and appearance within normal limits.   Right Kidney: Length: 12.5 cm. Echogenicity within normal limits. No mass or hydronephrosis visualized.   Left Kidney: Length: 12.1 cm. Echogenicity within normal limits. No mass or hydronephrosis visualized. Multiple parapelvic cysts measuring up to 2.3 cm.   Abdominal aorta: No aneurysm visualized.   Other findings: None.   IMPRESSION: 1. No cholelithiasis or sonographic evidence for acute cholecystitis. 2. Increased hepatic parenchymal echogenicity suggestive of steatosis.        Impression and Plan:  Zachary Taylor is a 73 y.o. male with history of hypertension, iron  deficiency anemia who presents for evaluation of GERD complicated with Barrett's esophagus, hepatic steatosis and chronic iron  deficiency  # GERD complicated with Barrett's esophagus  Last upper endoscopy 2022 with Barrett esophagus without dysplasia  I discussed with patient in detail today the pathophysiology of Barrett's esophagus that this is considered premalignant lesion and recommend PPI to be continued indefinitely with surveillance endoscopy to evaluate for any brewing  dysplasia  Recommend omeprazole  40 mg daily 30 minutes before breakfast to be continued indefinitely  Recommend upper endoscopy with [place] protocol every 3 to 5 years for surveillance depending on the length of Barrett's (short vs long).   Will schedule upper endoscopy with [place] protocol biopsies   # Iron  deficiency without anemia  Last labs from 09/2022 ferritin 14 TIBC 463 iron  saturation 15 Hemoglobin 14.3  Patient had extensive workup for evaluation of iron  deficiency with negative  bidirectional endoscopy and capsule endoscopy  Was previously following up with hematology at [facility] with IV iron  infusion  I recommend patient to continue to follow-up with hematology as last iron  profile consistent with iron  deficiency  Iron  deficiency could be due to [facility] lesion given large hiatal hernia ( 7cm), and mainstay treatment remains PPI and iron  supplementation   #Intermittent constipation Could be drug-induced as patient is on gabapentin  and diet low in fiber  Ensure adequate fluid intake: Aim for 8 glasses of water  daily. Follow a high fiber diet: Include foods such as dates, prunes, pears, and kiwi. Use Metamucil twice a day.  # Hepatic steatosis  Ultrasound with fatty liver but normal liver enzymes.  Mainstay treatment remains weight loss      - walking at a brisk pace/biking at moderate intesity 2.5-5 hours per week     - use pedometer/step counter to track activity     - goal to lose 5-10% of initial body weight     - avoid suagry drinks and juices, use zero calorie beverages     - increase water  intake     - eat a low carb diet with plenty of veggies and fruit     - Get sufficient sleep 7-8 hrs nightly     - maitain active lifestyle     - Counsel on lowering cholesterol by having a diet rich in vegetables,          protein (avoid red meats) and good fats(fish, salmon).   All questions were answered.      Yashika Mask Faizan Jaimya Feliciano, MD Gastroenterology and Hepatology [facility] Gastroenterology   This chart has been completed using [facility] Dictation software, and while attempts have been made to ensure accuracy , certain words and phrases may not be transcribed as intended

## 2024-06-08 NOTE — Patient Instructions (Signed)
 It was very nice to meet you today, as dicussed with will plan for the following :  1) omeprazole  40mg  before breakfast  2) upper endoscopy

## 2024-06-08 NOTE — Progress Notes (Signed)
 Hilaria Titsworth Faizan Cainen Burnham , M.D. Gastroenterology & Hepatology [facility] Gastroenterology [address] [place], [place] [address] Primary Care Physician: Shari Daughters, MD [address] [place] [address]  Chief Complaint:  GERD complicated with Barrett's esophagus, hepatic steatosis and chronic iron  deficiency  History of Present Illness:  Zachary Taylor is a 73 y.o. male with history of hypertension, iron  deficiency anemia who presents for evaluation of GERD complicated with Barrett's esophagus, hepatic steatosis and chronic iron  deficiency  Patient was last seen by [facility] December 2023 and is here for follow-up  Patient reports that after stopping PPI the symptoms of reflux have relapsed.  Has intermittent constipation Bristol stool scale bowel movement 2-3 with occasional straining .The patient denies having any nausea, vomiting, fever, chills, hematochezia, melena, hematemesis, abdominal distention, abdominal pain, diarrhea, jaundice, pruritus or weight loss.  Last labs from 09/2022 ferritin 14 TIBC 463 iron  saturation 15  Hemoglobin 14.3  Givens capsule study April 2023: normal Last Colonoscopy:09/26/21 - The entire examined colon is normal. - External hemorrhoids. - No specimens collected.  Suggest repeat 10 years Last Endoscopy:10/5/22Normal hypopharynx. - Normal esophagus. - Z-line irregular, 35 cm from the incisors. Biopsied-SSB - 7 cm hiatal hernia. - Gastritis. - Normal duodenal bulb and second portion of the duodenum.   Recommendations:  Repeat TCS 10 years  Path : A. GE JUNCTION, BIOPSY:  - Esophageal squamous and cardiac mucosa with intestinal metaplasia. See  comment  - Negative for dysplasia   COMMENT:   The findings are consistent with Barretts esophagus in an appropriate  clinical setting.   Past Medical History: Past Medical History:  Diagnosis Date   Hypertension     Past Surgical History: Past Surgical  History:  Procedure Laterality Date   BIOPSY  09/26/2021   Procedure: BIOPSY;  Surgeon: Ruby Corporal, MD;  [facility];  Service: Endoscopy;;   COLONOSCOPY WITH PROPOFOL  N/A 09/26/2021   Procedure: COLONOSCOPY WITH PROPOFOL ;  Surgeon: Ruby Corporal, MD;  [facility];  Service: Endoscopy;  Laterality: N/A;  2:20   ESOPHAGOGASTRODUODENOSCOPY (EGD) WITH PROPOFOL  N/A 09/26/2021   Procedure: ESOPHAGOGASTRODUODENOSCOPY (EGD) WITH PROPOFOL ;  Surgeon: Ruby Corporal, MD;  [facility];  Service: Endoscopy;  Laterality: N/A;   GIVENS CAPSULE STUDY N/A 04/15/2022   Procedure: GIVENS CAPSULE STUDY;  Surgeon: Ruby Corporal, MD;  [facility];  Service: Endoscopy;  Laterality: N/A;  730    Family History:History reviewed. No pertinent family history.  Social History: Social History   Tobacco Use  Smoking Status Never  Smokeless Tobacco Never   Social History   Substance and Sexual Activity  Alcohol Use No   Social History   Substance and Sexual Activity  Drug Use No    Allergies: No Known Allergies  Medications: Current Outpatient Medications  Medication Sig Dispense Refill   amLODipine -olmesartan  (AZOR ) 5-40 MG tablet Take 1 tablet by mouth daily. 30 tablet 0   ferrous sulfate 325 (65 FE) MG EC tablet Take 325 mg by mouth daily with breakfast.     gabapentin  (NEURONTIN ) 300 MG capsule Take 1 capsule (300 mg total) by mouth 3 (three) times daily. 90 capsule 2   meloxicam  (MOBIC ) 15 MG tablet Take 1 tablet (15 mg total) by mouth daily. 30 tablet 0   Pyridoxine HCl (B-6 PO) Take by mouth.     omeprazole  (PRILOSEC) 40 MG capsule Take 1 capsule (40 mg total) by mouth daily. 90 capsule 1  No current facility-administered medications for this visit.    Review of Systems: GENERAL: negative for malaise, night sweats HEENT: No changes in hearing or vision, no nose bleeds or other nasal problems. NECK: Negative for lumps, goiter, pain  and significant neck swelling RESPIRATORY: Negative for cough, wheezing CARDIOVASCULAR: Negative for chest pain, leg swelling, palpitations, orthopnea GI: SEE HPI MUSCULOSKELETAL: Negative for joint pain or swelling, back pain, and muscle pain. SKIN: Negative for lesions, rash HEMATOLOGY Negative for prolonged bleeding, bruising easily, and swollen nodes. ENDOCRINE: Negative for cold or heat intolerance, polyuria, polydipsia and goiter. NEURO: negative for tremor, gait imbalance, syncope and seizures. The remainder of the review of systems is noncontributory.   Physical Exam: BP 126/83   Pulse 83   Temp 98 F (36.7 C)   Ht 5' 10 (1.778 m)   Wt 214 lb 9.6 oz (97.3 kg)   BMI 30.79 kg/m  GENERAL: The patient is AO x3, in no acute distress. HEENT: Head is normocephalic and atraumatic. EOMI are intact. Mouth is well hydrated and without lesions. NECK: Supple. No masses LUNGS: Clear to auscultation. No presence of rhonchi/wheezing/rales. Adequate chest expansion HEART: RRR, normal s1 and s2. ABDOMEN: Soft, nontender, no guarding, no peritoneal signs, and nondistended. BS +. No masses.  Imaging/Labs: as above     Latest Ref Rng & Units 10/15/2022    4:12 PM 05/15/2022   11:13 AM 03/05/2022    9:32 AM  CBC  WBC 3.8 - 10.8 Thousand/uL 7.1  5.6  5.2   Hemoglobin 13.2 - 17.1 g/dL 01.0  27.2  53.6   Hematocrit 38.5 - 50.0 % 42.9  44.6  48.5   Platelets 140 - 400 Thousand/uL 217  242  220    Lab Results  Component Value Date   IRON  68 10/15/2022   TIBC 463 (H) 10/15/2022   FERRITIN 14 (L) 10/15/2022    I personally reviewed and interpreted the available labs, imaging and endoscopic files.  #Ultrasound Narrative & Impression  CLINICAL DATA:  Upper abdominal pain, intermittent.   EXAM: ABDOMEN ULTRASOUND COMPLETE   COMPARISON:  None Available.   FINDINGS: Gallbladder: No gallstones or wall thickening visualized. No sonographic Murphy sign noted by sonographer.   Common  bile duct: Diameter: 3.9 mm   Liver: Increased hepatic parenchymal echogenicity. No focal lesion. Portal vein is patent on color Doppler imaging with normal direction of blood flow towards the liver.   IVC: No abnormality visualized.   Pancreas: Visualized portion unremarkable.   Spleen: Size and appearance within normal limits.   Right Kidney: Length: 12.5 cm. Echogenicity within normal limits. No mass or hydronephrosis visualized.   Left Kidney: Length: 12.1 cm. Echogenicity within normal limits. No mass or hydronephrosis visualized. Multiple parapelvic cysts measuring up to 2.3 cm.   Abdominal aorta: No aneurysm visualized.   Other findings: None.   IMPRESSION: 1. No cholelithiasis or sonographic evidence for acute cholecystitis. 2. Increased hepatic parenchymal echogenicity suggestive of steatosis.        Impression and Plan:  Xavius Spadafore is a 73 y.o. male with history of hypertension, iron  deficiency anemia who presents for evaluation of GERD complicated with Barrett's esophagus, hepatic steatosis and chronic iron  deficiency  # GERD complicated with Barrett's esophagus  Last upper endoscopy 2022 with Barrett esophagus without dysplasia  I discussed with patient in detail today the pathophysiology of Barrett's esophagus that this is considered premalignant lesion and recommend PPI to be continued indefinitely with surveillance endoscopy to evaluate for any brewing  dysplasia  Recommend omeprazole  40 mg daily 30 minutes before breakfast to be continued indefinitely  Recommend upper endoscopy with [place] protocol every 3 to 5 years for surveillance depending on the length of Barrett's (short vs long).   Will schedule upper endoscopy with [place] protocol biopsies   # Iron  deficiency without anemia  Last labs from 09/2022 ferritin 14 TIBC 463 iron  saturation 15 Hemoglobin 14.3  Patient had extensive workup for evaluation of iron  deficiency with negative  bidirectional endoscopy and capsule endoscopy  Was previously following up with hematology at [facility] with IV iron  infusion  I recommend patient to continue to follow-up with hematology as last iron  profile consistent with iron  deficiency  Iron  deficiency could be due to [facility] lesion given large hiatal hernia ( 7cm), and mainstay treatment remains PPI and iron  supplementation   #Intermittent constipation Could be drug-induced as patient is on gabapentin  and diet low in fiber  Ensure adequate fluid intake: Aim for 8 glasses of water  daily. Follow a high fiber diet: Include foods such as dates, prunes, pears, and kiwi. Use Metamucil twice a day.  # Hepatic steatosis  Ultrasound with fatty liver but normal liver enzymes.  Mainstay treatment remains weight loss      - walking at a brisk pace/biking at moderate intesity 2.5-5 hours per week     - use pedometer/step counter to track activity     - goal to lose 5-10% of initial body weight     - avoid suagry drinks and juices, use zero calorie beverages     - increase water  intake     - eat a low carb diet with plenty of veggies and fruit     - Get sufficient sleep 7-8 hrs nightly     - maitain active lifestyle     - Counsel on lowering cholesterol by having a diet rich in vegetables,          protein (avoid red meats) and good fats(fish, salmon).   All questions were answered.      Yashika Mask Faizan Jaimya Feliciano, MD Gastroenterology and Hepatology [facility] Gastroenterology   This chart has been completed using [facility] Dictation software, and while attempts have been made to ensure accuracy , certain words and phrases may not be transcribed as intended

## 2024-06-08 NOTE — Progress Notes (Signed)
 Established Patient Office Visit  Subjective:  Patient ID: Zachary Taylor, male    DOB: [birth year]  Age: 73 y.o. MRN: [id number]  Chief Complaint  Patient presents with   Follow-up    2 week lab results    No new complaints, here for AWV refer to quality metrics and scanned documents. Failed to have previsit labs done. Saw GI today who recommended resumption of ppi. BP has improved with current rx.      No other concerns at this time.   Past Medical History:  Diagnosis Date   Hypertension     Past Surgical History:  Procedure Laterality Date   BIOPSY  09/26/2021   Procedure: BIOPSY;  Surgeon: Ruby Corporal, MD;  [facility];  Service: Endoscopy;;   COLONOSCOPY WITH PROPOFOL  N/A 09/26/2021   Procedure: COLONOSCOPY WITH PROPOFOL ;  Surgeon: Ruby Corporal, MD;  [facility];  Service: Endoscopy;  Laterality: N/A;  2:20   ESOPHAGOGASTRODUODENOSCOPY (EGD) WITH PROPOFOL  N/A 09/26/2021   Procedure: ESOPHAGOGASTRODUODENOSCOPY (EGD) WITH PROPOFOL ;  Surgeon: Ruby Corporal, MD;  [facility];  Service: Endoscopy;  Laterality: N/A;   GIVENS CAPSULE STUDY N/A 04/15/2022   Procedure: GIVENS CAPSULE STUDY;  Surgeon: Ruby Corporal, MD;  [facility];  Service: Endoscopy;  Laterality: N/A;  730    Social History   Socioeconomic History   Marital status: Married    Spouse name: Not on file   Number of children: Not on file   Years of education: Not on file   Highest education level: Not on file  Occupational History   Not on file  Tobacco Use   Smoking status: Never   Smokeless tobacco: Never  Vaping Use   Vaping status: Never Used  Substance and Sexual Activity   Alcohol use: No   Drug use: No   Sexual activity: Not on file  Other Topics Concern   Not on file  Social History Narrative   Not on file   Social Drivers of Health   Financial Resource Strain: Not on file  Food Insecurity: Not on file  Transportation Needs: Not  on file  Physical Activity: Not on file  Stress: Not on file  Social Connections: Not on file  Intimate Partner Violence: Not on file    No family history on file.  No Known Allergies  Outpatient Medications Prior to Visit  Medication Sig   amLODipine -olmesartan  (AZOR ) 5-40 MG tablet Take 1 tablet by mouth daily.   ferrous sulfate 325 (65 FE) MG EC tablet Take 325 mg by mouth daily with breakfast. (Patient not taking: Reported on 06/08/2024)   gabapentin  (NEURONTIN ) 300 MG capsule Take 1 capsule (300 mg total) by mouth 3 (three) times daily. (Patient not taking: Reported on 06/08/2024)   meloxicam  (MOBIC ) 15 MG tablet Take 1 tablet (15 mg total) by mouth daily. (Patient not taking: Reported on 06/08/2024)   omeprazole  (PRILOSEC) 40 MG capsule Take 1 capsule (40 mg total) by mouth daily. (Patient not taking: Reported on 06/08/2024)   Pyridoxine HCl (B-6 PO) Take by mouth. (Patient not taking: Reported on 06/08/2024)   No facility-administered medications prior to visit.    Review of Systems  Constitutional: Negative.  Negative for weight loss (gained 1 lb).  HENT: Negative.    Eyes: Negative.   Respiratory: Negative.    Cardiovascular: Negative.   Gastrointestinal: Negative.   Genitourinary: Negative.   Skin: Negative.   Neurological: Negative.   Endo/Heme/Allergies: Negative.  Objective:   BP 117/78   Pulse 91   Temp 98.5 F (36.9 C)   Ht 5' 10 (1.778 m)   Wt 215 lb (97.5 kg)   SpO2 96%   BMI 30.85 kg/m   Vitals:   06/08/24 1522  BP: 117/78  Pulse: 91  Temp: 98.5 F (36.9 C)  Height: 5' 10 (1.778 m)  Weight: 215 lb (97.5 kg)  SpO2: 96%  BMI (Calculated): 30.85    Physical Exam Vitals reviewed.  Constitutional:      Appearance: Normal appearance. He is obese.  HENT:     Head: Normocephalic.     Left Ear: There is no impacted cerumen.     Nose: Nose normal.     Mouth/Throat:     Mouth: Mucous membranes are moist.     Pharynx: No posterior  oropharyngeal erythema.   Eyes:     Extraocular Movements: Extraocular movements intact.     Pupils: Pupils are equal, round, and reactive to light.    Cardiovascular:     Rate and Rhythm: Regular rhythm.     Chest Wall: PMI is not displaced.     Pulses: Normal pulses.     Heart sounds: Normal heart sounds. No murmur heard. Pulmonary:     Effort: Pulmonary effort is normal.     Breath sounds: Normal air entry. No rhonchi or rales.  Abdominal:     General: Abdomen is flat. Bowel sounds are normal. There is no distension.     Palpations: Abdomen is soft. There is no hepatomegaly, splenomegaly or mass.     Tenderness: There is no abdominal tenderness.   Musculoskeletal:        General: Normal range of motion.     Cervical back: Normal range of motion and neck supple.     Right lower leg: No edema.     Left lower leg: No edema.   Skin:    General: Skin is warm and dry.   Neurological:     General: No focal deficit present.     Mental Status: He is alert and oriented to person, place, and time.     Cranial Nerves: No cranial nerve deficit.     Motor: No weakness.   Psychiatric:        Mood and Affect: Mood normal.        Behavior: Behavior normal.      No results found for any visits on 06/08/24.  No results found for this or any previous visit (from the past 2160 hours).    Assessment & Plan:  As per problem list Problem List Items Addressed This Visit       Cardiovascular and Mediastinum   Essential hypertension - Primary     Other   Iron  deficiency anemia   Other Visit Diagnoses       Lumbar radiculopathy           Return in about 3 months (around 09/08/2024) for fu with labs prior.   Total time spent: 30 minutes  Arzella Bitters, MD  06/08/2024   This document may have been prepared by [facility] Voice Recognition software and as such may include unintentional dictation errors.

## 2024-06-09 ENCOUNTER — Other Ambulatory Visit

## 2024-06-09 DIAGNOSIS — I10 Essential (primary) hypertension: Secondary | ICD-10-CM | POA: Diagnosis not present

## 2024-06-10 LAB — COMPREHENSIVE METABOLIC PANEL WITH GFR
ALT: 27 IU/L (ref 0–44)
AST: 22 IU/L (ref 0–40)
Albumin: 4.5 g/dL (ref 3.8–4.8)
Alkaline Phosphatase: 67 IU/L (ref 44–121)
BUN/Creatinine Ratio: 18 (ref 10–24)
BUN: 25 mg/dL (ref 8–27)
Bilirubin Total: 0.9 mg/dL (ref 0.0–1.2)
CO2: 21 mmol/L (ref 20–29)
Calcium: 9.5 mg/dL (ref 8.6–10.2)
Chloride: 103 mmol/L (ref 96–106)
Creatinine, Ser: 1.36 mg/dL — ABNORMAL HIGH (ref 0.76–1.27)
Globulin, Total: 2.7 g/dL (ref 1.5–4.5)
Glucose: 119 mg/dL — ABNORMAL HIGH (ref 70–99)
Potassium: 4.9 mmol/L (ref 3.5–5.2)
Sodium: 137 mmol/L (ref 134–144)
Total Protein: 7.2 g/dL (ref 6.0–8.5)
eGFR: 56 mL/min/{1.73_m2} — ABNORMAL LOW (ref >59)

## 2024-06-10 LAB — LIPID PANEL
Chol/HDL Ratio: 5.6 ratio — ABNORMAL HIGH (ref 0.0–5.0)
Cholesterol, Total: 209 mg/dL — ABNORMAL HIGH (ref 100–199)
HDL: 37 mg/dL — ABNORMAL LOW (ref >39)
LDL Chol Calc (NIH): 141 mg/dL — ABNORMAL HIGH (ref 0–99)
Triglycerides: 170 mg/dL — ABNORMAL HIGH (ref 0–149)
VLDL Cholesterol Cal: 31 mg/dL (ref 5–40)

## 2024-06-10 LAB — CBC WITH DIFF/PLATELET
Basophils Absolute: 0.1 10*3/uL (ref 0.0–0.2)
Basos: 1 %
EOS (ABSOLUTE): 0.4 10*3/uL (ref 0.0–0.4)
Eos: 8 %
Hematocrit: 45.3 % (ref 37.5–51.0)
Hemoglobin: 14.6 g/dL (ref 13.0–17.7)
Immature Grans (Abs): 0 10*3/uL (ref 0.0–0.1)
Immature Granulocytes: 0 %
Lymphocytes Absolute: 1.7 10*3/uL (ref 0.7–3.1)
Lymphs: 31 %
MCH: 28.3 pg (ref 26.6–33.0)
MCHC: 32.2 g/dL (ref 31.5–35.7)
MCV: 88 fL (ref 79–97)
Monocytes Absolute: 0.6 10*3/uL (ref 0.1–0.9)
Monocytes: 12 %
Neutrophils Absolute: 2.6 10*3/uL (ref 1.4–7.0)
Neutrophils: 48 %
Platelets: 219 10*3/uL (ref 150–450)
RBC: 5.15 x10E6/uL (ref 4.14–5.80)
RDW: 13.6 % (ref 11.6–15.4)
WBC: 5.4 10*3/uL (ref 3.4–10.8)

## 2024-06-14 ENCOUNTER — Other Ambulatory Visit: Payer: Self-pay | Admitting: Internal Medicine

## 2024-06-14 ENCOUNTER — Ambulatory Visit: Payer: Self-pay | Admitting: Internal Medicine

## 2024-06-14 DIAGNOSIS — E782 Mixed hyperlipidemia: Secondary | ICD-10-CM

## 2024-06-14 MED ORDER — ROSUVASTATIN CALCIUM 10 MG PO TABS: 10.0000 mg | ORAL_TABLET | Freq: Every day | ORAL | 11 refills | Status: AC

## 2024-06-16 ENCOUNTER — Telehealth: Payer: Self-pay | Admitting: Internal Medicine

## 2024-06-16 NOTE — Telephone Encounter (Signed)
 Patient left VM requesting a call back. Did not state what he needs.

## 2024-06-16 NOTE — Progress Notes (Signed)
 Patient notified

## 2024-06-18 ENCOUNTER — Encounter: Payer: Self-pay | Admitting: Internal Medicine

## 2024-06-18 ENCOUNTER — Ambulatory Visit (INDEPENDENT_AMBULATORY_CARE_PROVIDER_SITE_OTHER): Admitting: Gastroenterology

## 2024-06-18 ENCOUNTER — Ambulatory Visit (INDEPENDENT_AMBULATORY_CARE_PROVIDER_SITE_OTHER): Admitting: Internal Medicine

## 2024-06-18 VITALS — BP 138/76 | HR 102 | Ht 70.0 in | Wt 226.0 lb

## 2024-06-18 DIAGNOSIS — I10 Essential (primary) hypertension: Secondary | ICD-10-CM

## 2024-06-18 DIAGNOSIS — N159 Renal tubulo-interstitial disease, unspecified: Secondary | ICD-10-CM | POA: Diagnosis not present

## 2024-06-18 DIAGNOSIS — E66811 Obesity, class 1: Secondary | ICD-10-CM | POA: Diagnosis not present

## 2024-06-18 LAB — POCT URINALYSIS DIPSTICK
Bilirubin, UA: NEGATIVE
Blood, UA: NEGATIVE
Glucose, UA: NEGATIVE
Ketones, UA: NEGATIVE
Leukocytes, UA: NEGATIVE
Nitrite, UA: NEGATIVE
Protein, UA: NEGATIVE
Spec Grav, UA: 1.01 (ref 1.010–1.025)
Urobilinogen, UA: 0.2 U/dL
pH, UA: 6 (ref 5.0–8.0)

## 2024-06-18 NOTE — Progress Notes (Signed)
 Established Patient Office Visit  Subjective:  Patient ID: Zachary Taylor, male    DOB: Aug 27, 1951  Age: 73 y.o. MRN: [id number]  Chief Complaint  Patient presents with   Acute Visit    Possible kidney infection    C/o of  I think I have a kidney infection. Stopped meloxicam  based on last results. Labs reviewed and notable for renal dysfunction and hyperlipidemia.    No other concerns at this time.   Past Medical History:  Diagnosis Date   Hypertension     Past Surgical History:  Procedure Laterality Date   BIOPSY  09/26/2021   Procedure: BIOPSY;  Surgeon: Golda Claudis PENNER, MD;  [facility];  Service: Endoscopy;;   COLONOSCOPY WITH PROPOFOL  N/A 09/26/2021   Procedure: COLONOSCOPY WITH PROPOFOL ;  Surgeon: Golda Claudis PENNER, MD;  [facility];  Service: Endoscopy;  Laterality: N/A;  2:20   ESOPHAGOGASTRODUODENOSCOPY (EGD) WITH PROPOFOL  N/A 09/26/2021   Procedure: ESOPHAGOGASTRODUODENOSCOPY (EGD) WITH PROPOFOL ;  Surgeon: Golda Claudis PENNER, MD;  [facility];  Service: Endoscopy;  Laterality: N/A;   GIVENS CAPSULE STUDY N/A 04/15/2022   Procedure: GIVENS CAPSULE STUDY;  Surgeon: Golda Claudis PENNER, MD;  [facility];  Service: Endoscopy;  Laterality: N/A;  730    Social History   Socioeconomic History   Marital status: Married    Spouse name: Not on file   Number of children: Not on file   Years of education: Not on file   Highest education level: Not on file  Occupational History   Not on file  Tobacco Use   Smoking status: Never   Smokeless tobacco: Never  Vaping Use   Vaping status: Never Used  Substance and Sexual Activity   Alcohol use: No   Drug use: No   Sexual activity: Not on file  Other Topics Concern   Not on file  Social History Narrative   Not on file   Social Drivers of Health   Financial Resource Strain: Not on file  Food Insecurity: Not on file  Transportation Needs: Not on file  Physical Activity: Not on  file  Stress: Not on file  Social Connections: Not on file  Intimate Partner Violence: Not on file    No family history on file.  No Known Allergies  Outpatient Medications Prior to Visit  Medication Sig Note   amLODipine -olmesartan  (AZOR ) 5-40 MG tablet Take 1 tablet by mouth daily.    ferrous sulfate 325 (65 FE) MG EC tablet Take 325 mg by mouth daily with breakfast.    gabapentin  (NEURONTIN ) 300 MG capsule Take 1 capsule (300 mg total) by mouth 3 (three) times daily.    omeprazole  (PRILOSEC) 40 MG capsule Take 1 capsule (40 mg total) by mouth daily.    rosuvastatin  (CRESTOR ) 10 MG tablet Take 1 tablet (10 mg total) by mouth daily.    tadalafil  (CIALIS ) 20 MG tablet Take 1 tablet (20 mg total) by mouth daily as needed for erectile dysfunction.    [DISCONTINUED] meloxicam  (MOBIC ) 15 MG tablet Take 1 tablet (15 mg total) by mouth daily. (Patient not taking: Reported on 06/18/2024) 06/18/2024: CKD   [DISCONTINUED] Pyridoxine HCl (B-6 PO) Take by mouth. (Patient not taking: Reported on 06/18/2024)    No facility-administered medications prior to visit.    Review of Systems  Constitutional: Negative.  Negative for weight loss (gained 1 lb).  HENT: Negative.    Eyes: Negative.   Respiratory: Negative.    Cardiovascular:  Negative.   Gastrointestinal: Negative.   Genitourinary: Negative.   Skin: Negative.   Neurological: Negative.   Endo/Heme/Allergies: Negative.        Objective:   BP 138/76 (Cuff Size: Normal)   Pulse (!) 102   Ht 5' 10 (1.778 m)   Wt 226 lb (102.5 kg)   SpO2 96%   BMI 32.43 kg/m   Vitals:   06/18/24 1053 06/18/24 1130  BP: (!) 140/80 138/76  Pulse: (!) 102   Height: 5' 10 (1.778 m)   Weight: 226 lb (102.5 kg)   SpO2: 96%   BMI (Calculated): 32.43     Physical Exam Vitals reviewed.  Constitutional:      Appearance: Normal appearance. He is obese.  HENT:     Head: Normocephalic.     Left Ear: There is no impacted cerumen.     Nose: Nose  normal.     Mouth/Throat:     Mouth: Mucous membranes are moist.     Pharynx: No posterior oropharyngeal erythema.   Eyes:     Extraocular Movements: Extraocular movements intact.     Pupils: Pupils are equal, round, and reactive to light.    Cardiovascular:     Rate and Rhythm: Regular rhythm.     Chest Wall: PMI is not displaced.     Pulses: Normal pulses.     Heart sounds: Normal heart sounds. No murmur heard. Pulmonary:     Effort: Pulmonary effort is normal.     Breath sounds: Normal air entry. No rhonchi or rales.  Abdominal:     General: Abdomen is flat. Bowel sounds are normal. There is no distension.     Palpations: Abdomen is soft. There is no hepatomegaly, splenomegaly or mass.     Tenderness: There is no abdominal tenderness.   Musculoskeletal:        General: Normal range of motion.     Cervical back: Normal range of motion and neck supple.     Right lower leg: No edema.     Left lower leg: No edema.   Skin:    General: Skin is warm and dry.   Neurological:     General: No focal deficit present.     Mental Status: He is alert and oriented to person, place, and time.     Cranial Nerves: No cranial nerve deficit.     Motor: No weakness.   Psychiatric:        Mood and Affect: Mood normal.        Behavior: Behavior normal.      Results for orders placed or performed in visit on 06/18/24  POCT urinalysis dipstick  Result Value Ref Range   Color, UA yellow    Clarity, UA clear    Glucose, UA Negative Negative   Bilirubin, UA negative    Ketones, UA negative    Spec Grav, UA 1.010 1.010 - 1.025   Blood, UA negative    pH, UA 6.0 5.0 - 8.0   Protein, UA Negative Negative   Urobilinogen, UA 0.2 0.2 or 1.0 E.U./dL   Nitrite, UA negative    Leukocytes, UA Negative Negative   Appearance     Odor      Recent Results (from the past 2160 hours)  CBC With Diff/Platelet     Status: None   Collection Time: 06/09/24  8:46 AM  Result Value Ref Range   WBC  5.4 3.4 - 10.8 x10E3/uL   RBC 5.15 4.14 - 5.80  x10E6/uL   Hemoglobin 14.6 13.0 - 17.7 g/dL   Hematocrit 54.6 62.4 - 51.0 %   MCV 88 79 - 97 fL   MCH 28.3 26.6 - 33.0 pg   MCHC 32.2 31.5 - 35.7 g/dL   RDW 86.3 88.3 - 84.5 %   Platelets 219 150 - 450 x10E3/uL   Neutrophils 48 Not Estab. %   Lymphs 31 Not Estab. %   Monocytes 12 Not Estab. %   Eos 8 Not Estab. %   Basos 1 Not Estab. %   Neutrophils Absolute 2.6 1.4 - 7.0 x10E3/uL   Lymphocytes Absolute 1.7 0.7 - 3.1 x10E3/uL   Monocytes Absolute 0.6 0.1 - 0.9 x10E3/uL   EOS (ABSOLUTE) 0.4 0.0 - 0.4 x10E3/uL   Basophils Absolute 0.1 0.0 - 0.2 x10E3/uL   Immature Granulocytes 0 Not Estab. %   Immature Grans (Abs) 0.0 0.0 - 0.1 x10E3/uL  Comprehensive metabolic panel with GFR     Status: Abnormal   Collection Time: 06/09/24  8:46 AM  Result Value Ref Range   Glucose 119 (H) 70 - 99 mg/dL   BUN 25 8 - 27 mg/dL   Creatinine, Ser 8.63 (H) 0.76 - 1.27 mg/dL   eGFR 56 (L) >40 fO/fpw/8.26   BUN/Creatinine Ratio 18 10 - 24   Sodium 137 134 - 144 mmol/L   Potassium 4.9 3.5 - 5.2 mmol/L   Chloride 103 96 - 106 mmol/L   CO2 21 20 - 29 mmol/L   Calcium  9.5 8.6 - 10.2 mg/dL   Total Protein 7.2 6.0 - 8.5 g/dL   Albumin 4.5 3.8 - 4.8 g/dL   Globulin, Total 2.7 1.5 - 4.5 g/dL   Bilirubin Total 0.9 0.0 - 1.2 mg/dL   Alkaline Phosphatase 67 44 - 121 IU/L   AST 22 0 - 40 IU/L   ALT 27 0 - 44 IU/L  Lipid panel     Status: Abnormal   Collection Time: 06/09/24  8:46 AM  Result Value Ref Range   Cholesterol, Total 209 (H) 100 - 199 mg/dL   Triglycerides 829 (H) 0 - 149 mg/dL   HDL 37 (L) >60 mg/dL   VLDL Cholesterol Cal 31 5 - 40 mg/dL   LDL Chol Calc (NIH) 858 (H) 0 - 99 mg/dL   Chol/HDL Ratio 5.6 (H) 0.0 - 5.0 ratio    Comment:                                   T. Chol/HDL Ratio                                             Men  Women                               1/2 Avg.Risk  3.4    3.3                                   Avg.Risk  5.0     4.4  2X Avg.Risk  9.6    7.1                                3X Avg.Risk 23.4   11.0   POCT urinalysis dipstick     Status: None   Collection Time: 06/18/24 11:17 AM  Result Value Ref Range   Color, UA yellow    Clarity, UA clear    Glucose, UA Negative Negative   Bilirubin, UA negative    Ketones, UA negative    Spec Grav, UA 1.010 1.010 - 1.025   Blood, UA negative    pH, UA 6.0 5.0 - 8.0   Protein, UA Negative Negative   Urobilinogen, UA 0.2 0.2 or 1.0 E.U./dL   Nitrite, UA negative    Leukocytes, UA Negative Negative   Appearance     Odor        Assessment & Plan:  As per problem list  Problem List Items Addressed This Visit   None Visit Diagnoses       Kidney infection    -  Primary   Relevant Orders   POCT urinalysis dipstick (Completed)     Obesity (BMI 30.0-34.9)           Return keep appt.   Total time spent: 20 minutes  Sherrill Cinderella Perry, MD  06/18/2024   This document may have been prepared by [facility] Voice Recognition software and as such may include unintentional dictation errors.

## 2024-06-21 ENCOUNTER — Encounter: Payer: Self-pay | Admitting: Oncology

## 2024-06-21 ENCOUNTER — Inpatient Hospital Stay

## 2024-06-21 ENCOUNTER — Inpatient Hospital Stay: Attending: Oncology | Admitting: Oncology

## 2024-06-21 VITALS — BP 105/70 | HR 89 | Temp 98.1°F | Resp 18 | Ht 70.0 in | Wt 219.0 lb

## 2024-06-21 DIAGNOSIS — D509 Iron deficiency anemia, unspecified: Secondary | ICD-10-CM | POA: Insufficient documentation

## 2024-06-21 LAB — FERRITIN: Ferritin: 28 ng/mL (ref 24–336)

## 2024-06-21 LAB — CBC (CANCER CENTER ONLY)
HCT: 40.6 % (ref 39.0–52.0)
Hemoglobin: 13.7 g/dL (ref 13.0–17.0)
MCH: 28.1 pg (ref 26.0–34.0)
MCHC: 33.7 g/dL (ref 30.0–36.0)
MCV: 83.2 fL (ref 80.0–100.0)
Platelet Count: 205 10*3/uL (ref 150–400)
RBC: 4.88 MIL/uL (ref 4.22–5.81)
RDW: 13.3 % (ref 11.5–15.5)
WBC Count: 6.4 10*3/uL (ref 4.0–10.5)
nRBC: 0 % (ref 0.0–0.2)

## 2024-06-21 LAB — FOLATE: Folate: 16.2 ng/mL (ref >5.9)

## 2024-06-21 LAB — IRON AND TIBC
Iron: 75 ug/dL (ref 45–182)
Saturation Ratios: 16 % — ABNORMAL LOW (ref 17.9–39.5)
TIBC: 473 ug/dL — ABNORMAL HIGH (ref 250–450)
UIBC: 398 ug/dL

## 2024-06-21 LAB — VITAMIN B12: Vitamin B-12: 302 pg/mL (ref 180–914)

## 2024-06-21 NOTE — Progress Notes (Signed)
 [facility]  Telephone:([phone]) [phone] Fax:([phone]) [phone]  ID: Zachary Taylor OB: 20-Aug-1951  MR#: [id number]  RDW#:[id number]  Patient Care Team: Albina GORMAN Dine, MD as PCP - General (Internal Medicine) Jacobo Zachary PARAS, MD as Consulting Physician (Hematology and Oncology)  CHIEF COMPLAINT: History of iron  deficiency anemia.  INTERVAL HISTORY: Patient is a 73 year old male who has not been evaluated in clinic for nearly 3 years who is referred back to reestablish care for his history of iron  deficiency anemia.  He currently feels well and is asymptomatic.  He does not complain of any weakness or fatigue.  He has no neurologic complaints.  He denies any recent fevers or illnesses.  He has a good appetite and denies weight loss.  He has no chest pain, shortness of breath, cough, or hemoptysis.  He denies any nausea, vomiting, constipation, or diarrhea.  He has no melena or hematochezia.  He has no urinary complaints.  Patient offers no specific complaints today.  REVIEW OF SYSTEMS:   Review of Systems  Constitutional: Negative.  Negative for fever, malaise/fatigue and weight loss.  Respiratory: Negative.  Negative for cough, hemoptysis and shortness of breath.   Cardiovascular: Negative.  Negative for chest pain and leg swelling.  Gastrointestinal: Negative.  Negative for abdominal pain, blood in stool and melena.  Genitourinary: Negative.  Negative for dysuria.  Musculoskeletal: Negative.  Negative for back pain.  Skin: Negative.  Negative for rash.  Neurological: Negative.  Negative for dizziness, focal weakness, weakness and headaches.  Psychiatric/Behavioral: Negative.  The patient is not nervous/anxious.     As per HPI. Otherwise, a complete review of systems is negative.  PAST MEDICAL HISTORY: Past Medical History:  Diagnosis Date   Hyperlipidemia    Hypertension     PAST SURGICAL HISTORY: Past Surgical History:  Procedure Laterality Date   BIOPSY   09/26/2021   Procedure: BIOPSY;  Surgeon: Golda Claudis PENNER, MD;  Location: [facility];  Service: Endoscopy;;   COLONOSCOPY WITH PROPOFOL  N/A 09/26/2021   Procedure: COLONOSCOPY WITH PROPOFOL ;  Surgeon: Golda Claudis PENNER, MD;  Location: [facility];  Service: Endoscopy;  Laterality: N/A;  2:20   ESOPHAGOGASTRODUODENOSCOPY (EGD) WITH PROPOFOL  N/A 09/26/2021   Procedure: ESOPHAGOGASTRODUODENOSCOPY (EGD) WITH PROPOFOL ;  Surgeon: Golda Claudis PENNER, MD;  Location: [facility];  Service: Endoscopy;  Laterality: N/A;   GIVENS CAPSULE STUDY N/A 04/15/2022   Procedure: GIVENS CAPSULE STUDY;  Surgeon: Golda Claudis PENNER, MD;  Location: [facility];  Service: Endoscopy;  Laterality: N/A;  730    FAMILY HISTORY: History reviewed. No pertinent family history.  ADVANCED DIRECTIVES (Y/N):  N  HEALTH MAINTENANCE: Social History   Tobacco Use   Smoking status: Never   Smokeless tobacco: Never  Vaping Use   Vaping status: Never Used  Substance Use Topics   Alcohol use: No   Drug use: No     Colonoscopy:  PAP:  Bone density:  Lipid panel:  No Known Allergies  Current Outpatient Medications  Medication Sig Dispense Refill   amLODipine -olmesartan  (AZOR ) 5-40 MG tablet Take 1 tablet by mouth daily. 90 tablet 0   gabapentin  (NEURONTIN ) 300 MG capsule Take 1 capsule (300 mg total) by mouth 3 (three) times daily. 90 capsule 2   Olopatadine  HCl 0.2 % SOLN Apply 1 drop to eye in the morning, at noon, in the evening, and at bedtime for 5 days. 1 mL 0   omeprazole  (PRILOSEC) 40 MG capsule Take 1 capsule (40 mg total) by  mouth daily. 90 capsule 1   rosuvastatin  (CRESTOR ) 10 MG tablet Take 1 tablet (10 mg total) by mouth daily. 30 tablet 11   tadalafil  (CIALIS ) 20 MG tablet Take 1 tablet (20 mg total) by mouth daily as needed for erectile dysfunction. 30 tablet 2   ferrous sulfate 325 (65 FE) MG EC tablet Take 325 mg by mouth daily with breakfast. (Patient not taking: Reported on 06/21/2024)     No  current facility-administered medications for this visit.    OBJECTIVE: Vitals:   06/21/24 1057  BP: 105/70  Pulse: 89  Resp: 18  Temp: 98.1 F (36.7 C)  SpO2: 97%     Body mass index is 31.42 kg/m.    ECOG FS:0 - Asymptomatic  General: Well-developed, well-nourished, no acute distress. Eyes: Pink conjunctiva, anicteric sclera. HEENT: Normocephalic, moist mucous membranes. Lungs: No audible wheezing or coughing. Heart: Regular rate and rhythm. Abdomen: Soft, nontender, no obvious distention. Musculoskeletal: No edema, cyanosis, or clubbing. Neuro: Alert, answering all questions appropriately. Cranial nerves grossly intact. Skin: No rashes or petechiae noted. Psych: Normal affect. Lymphatics: No cervical, calvicular, axillary or inguinal LAD.   LAB RESULTS:  Lab Results  Component Value Date   NA 137 06/09/2024   K 4.9 06/09/2024   CL 103 06/09/2024   CO2 21 06/09/2024   GLUCOSE 119 (H) 06/09/2024   BUN 25 06/09/2024   CREATININE 1.36 (H) 06/09/2024   CALCIUM  9.5 06/09/2024   PROT 7.2 06/09/2024   ALBUMIN 4.5 06/09/2024   AST 22 06/09/2024   ALT 27 06/09/2024   ALKPHOS 67 06/09/2024   BILITOT 0.9 06/09/2024   GFRNONAA >60 09/24/2021   GFRAA 86 05/23/2020    Lab Results  Component Value Date   WBC 6.4 06/21/2024   NEUTROABS 2.6 06/09/2024   HGB 13.7 06/21/2024   HCT 40.6 06/21/2024   MCV 83.2 06/21/2024   PLT 205 06/21/2024   Lab Results  Component Value Date   IRON  68 10/15/2022   TIBC 463 (H) 10/15/2022   IRONPCTSAT 15 (L) 10/15/2022   Lab Results  Component Value Date   FERRITIN 14 (L) 10/15/2022     STUDIES: No results found.  ASSESSMENT: History of iron  deficiency anemia.  PLAN:    History of iron  deficiency anemia: Patient underwent colonoscopy and EGD on September 26, 2021 that did not reveal any significant pathology.  Patient reports he has repeat luminal evaluation in July 2025.  Patient's hemoglobin from today continues to be within  normal limits at 13.7.  All of his other laboratory work including iron  stores and ferritin are pending at time of dictation.  No intervention is needed at this time.  Patient does not require additional IV Venofer .  He last received treatment on August 13, 2021.  Return to clinic in 6 months with repeat laboratory work and further evaluation.  I spent a total of 45 minutes reviewing chart data, face-to-face evaluation with the patient, counseling and coordination of care as detailed above.   Patient expressed understanding and was in agreement with this plan. He also understands that He can call clinic at any time with any questions, concerns, or complaints.    Zachary JINNY Reusing, MD   06/21/2024 12:29 PM

## 2024-06-22 ENCOUNTER — Encounter (HOSPITAL_COMMUNITY)
Admission: RE | Admit: 2024-06-22 | Discharge: 2024-06-22 | Disposition: A | Discharge status: Home / Self Care | Source: Ambulatory Visit | Attending: Gastroenterology | Admitting: Gastroenterology

## 2024-06-22 ENCOUNTER — Other Ambulatory Visit: Payer: Self-pay

## 2024-06-22 ENCOUNTER — Encounter (HOSPITAL_COMMUNITY): Payer: Self-pay

## 2024-06-24 ENCOUNTER — Ambulatory Visit (HOSPITAL_COMMUNITY)

## 2024-06-24 ENCOUNTER — Encounter (HOSPITAL_COMMUNITY): Payer: Self-pay | Admitting: Gastroenterology

## 2024-06-24 ENCOUNTER — Encounter (HOSPITAL_COMMUNITY)
Admission: RE | Disposition: A | Discharge status: Home / Self Care | Payer: Self-pay | Source: Home / Self Care | Attending: Gastroenterology

## 2024-06-24 ENCOUNTER — Ambulatory Visit (HOSPITAL_COMMUNITY)
Admission: RE | Admit: 2024-06-24 | Discharge: 2024-06-24 | Disposition: A | Discharge status: Home / Self Care | Attending: Gastroenterology | Admitting: Gastroenterology

## 2024-06-24 DIAGNOSIS — K449 Diaphragmatic hernia without obstruction or gangrene: Secondary | ICD-10-CM | POA: Insufficient documentation

## 2024-06-24 DIAGNOSIS — Z791 Long term (current) use of non-steroidal anti-inflammatories (NSAID): Secondary | ICD-10-CM | POA: Insufficient documentation

## 2024-06-24 DIAGNOSIS — K2950 Unspecified chronic gastritis without bleeding: Secondary | ICD-10-CM | POA: Diagnosis not present

## 2024-06-24 DIAGNOSIS — K2270 Barrett's esophagus without dysplasia: Secondary | ICD-10-CM | POA: Diagnosis not present

## 2024-06-24 DIAGNOSIS — K2289 Other specified disease of esophagus: Secondary | ICD-10-CM | POA: Insufficient documentation

## 2024-06-24 DIAGNOSIS — K209 Esophagitis, unspecified without bleeding: Secondary | ICD-10-CM

## 2024-06-24 DIAGNOSIS — K219 Gastro-esophageal reflux disease without esophagitis: Secondary | ICD-10-CM | POA: Insufficient documentation

## 2024-06-24 DIAGNOSIS — K2930 Chronic superficial gastritis without bleeding: Secondary | ICD-10-CM

## 2024-06-24 DIAGNOSIS — K3189 Other diseases of stomach and duodenum: Secondary | ICD-10-CM | POA: Insufficient documentation

## 2024-06-24 DIAGNOSIS — K760 Fatty (change of) liver, not elsewhere classified: Secondary | ICD-10-CM | POA: Insufficient documentation

## 2024-06-24 DIAGNOSIS — Z79899 Other long term (current) drug therapy: Secondary | ICD-10-CM | POA: Diagnosis not present

## 2024-06-24 DIAGNOSIS — E611 Iron deficiency: Secondary | ICD-10-CM | POA: Diagnosis not present

## 2024-06-24 DIAGNOSIS — I10 Essential (primary) hypertension: Secondary | ICD-10-CM | POA: Insufficient documentation

## 2024-06-24 DIAGNOSIS — K5900 Constipation, unspecified: Secondary | ICD-10-CM | POA: Insufficient documentation

## 2024-06-24 DIAGNOSIS — K644 Residual hemorrhoidal skin tags: Secondary | ICD-10-CM | POA: Diagnosis not present

## 2024-06-24 HISTORY — PX: ESOPHAGOGASTRODUODENOSCOPY: SHX5428

## 2024-06-24 SURGERY — EGD (ESOPHAGOGASTRODUODENOSCOPY)
Anesthesia: General

## 2024-06-24 MED ORDER — OMEPRAZOLE 20 MG PO CPDR: 20.0000 mg | DELAYED_RELEASE_CAPSULE | Freq: Every day | ORAL | 1 refills | Status: DC

## 2024-06-24 MED ORDER — DEXMEDETOMIDINE HCL IN NACL 80 MCG/20ML IV SOLN
INTRAVENOUS | Status: DC | PRN
  Administered 2024-06-24: 8 ug via INTRAVENOUS

## 2024-06-24 MED ORDER — LACTATED RINGERS IV SOLN: INTRAVENOUS | Status: DC | PRN

## 2024-06-24 MED ORDER — PROPOFOL 500 MG/50ML IV EMUL
INTRAVENOUS | Status: DC | PRN
  Administered 2024-06-24: 150 ug/kg/min via INTRAVENOUS

## 2024-06-24 MED ORDER — LIDOCAINE 2% (20 MG/ML) 5 ML SYRINGE
INTRAMUSCULAR | Status: DC | PRN
Start: 2024-06-24 — End: 2024-06-24
  Administered 2024-06-24: 50 mg via INTRAVENOUS

## 2024-06-24 MED ORDER — OMEPRAZOLE 20 MG PO CPDR: 20.0000 mg | DELAYED_RELEASE_CAPSULE | Freq: Every day | ORAL | 1 refills | Status: AC

## 2024-06-24 MED ORDER — PROPOFOL 10 MG/ML IV BOLUS
INTRAVENOUS | Status: DC | PRN
  Administered 2024-06-24: 20 mg via INTRAVENOUS
  Administered 2024-06-24: 50 mg via INTRAVENOUS

## 2024-06-24 MED ORDER — PHENYLEPHRINE 80 MCG/ML (10ML) SYRINGE FOR IV PUSH (FOR BLOOD PRESSURE SUPPORT)
PREFILLED_SYRINGE | INTRAVENOUS | Status: DC | PRN
  Administered 2024-06-24: 80 ug via INTRAVENOUS
  Administered 2024-06-24: 160 ug via INTRAVENOUS

## 2024-06-24 NOTE — Op Note (Signed)
 [facility] Patient Name: Zachary Taylor Procedure Date: 06/24/2024 9:30 AM MRN: [id number] Date of Birth: 1951-10-26 Attending MD: Deatrice Dine , MD, [id number] CSN: [id number] Age: 73 Admit Type: Outpatient Procedure:                Upper GI endoscopy Indications:              Barrett's esophagus, Follow-up of Barrett's                            esophagus Providers:                Deatrice Dine, MD, Leandrew Edelman RN, RN, Rosina Sprague, Gordy Lonni Balm, Technician Referring MD:              Medicines:                Monitored Anesthesia Care Complications:            No immediate complications. Estimated Blood Loss:     Estimated blood loss: none. Estimated blood loss                            was minimal. Procedure:                Pre-Anesthesia Assessment:                           - Prior to the procedure, a History and Physical                            was performed, and patient medications and                            allergies were reviewed. The patient's tolerance of                            previous anesthesia was also reviewed. The risks                            and benefits of the procedure and the sedation                            options and risks were discussed with the patient.                            All questions were answered, and informed consent                            was obtained. Prior Anticoagulants: The patient has                            taken no anticoagulant or antiplatelet agents. ASA                            Grade  Assessment: II - A patient with mild systemic                            disease. After reviewing the risks and benefits,                            the patient was deemed in satisfactory condition to                            undergo the procedure.                           After obtaining informed consent, the endoscope was                            passed under direct vision. Throughout  the                            procedure, the patient's blood pressure, pulse, and                            oxygen saturations were monitored continuously. The                            [id number]-H190 ([id number]) scope was introduced through the                            mouth, and advanced to the second part of duodenum.                            The upper GI endoscopy was accomplished without                            difficulty. The patient tolerated the procedure                            well. Scope In: 9:48:26 AM Scope Out: 9:55:07 AM Total Procedure Duration: 0 hours 6 minutes 41 seconds  Findings:      There were esophageal mucosal changes consistent with short-segment       Barrett's esophagus present in the lower third of the esophagus. The       maximum longitudinal extent of these mucosal changes was 2 cm in length.       Mucosa was biopsied with a cold forceps for histology in 4 quadrants at       intervals of 1 cm. One specimen bottle was sent to pathology.      The Z-line was irregular and was found 35 cm from the incisors.      Esophagogastric landmarks were identified: the Z-line was found at 35 cm       and the gastroesophageal junction was found at 42 cm from the incisors.      A 7 cm hiatal hernia was present.      Mildly erythematous mucosa without bleeding was found in the gastric       antrum. This was biopsied with a cold  forceps for histology.      The duodenal bulb and second portion of the duodenum were normal. Impression:               - Esophageal mucosal changes consistent with                            short-segment Barrett's esophagus. Biopsied.                           - Z-line irregular, 35 cm from the incisors.                           - Esophagogastric landmarks identified.                           - 7 cm hiatal hernia.                           - Erythematous mucosa in the antrum. Biopsied.                           - Normal duodenal bulb and  second portion of the                            duodenum. Moderate Sedation:      Per Anesthesia Care Recommendation:           - Patient has a contact number available for                            emergencies. The signs and symptoms of potential                            delayed complications were discussed with the                            patient. Return to normal activities tomorrow.                            Written discharge instructions were provided to the                            patient.                           - Resume previous diet.                           - Continue present medications.                           - Await pathology results.                           - Repeat upper endoscopy in 3 - 5 years for  surveillance.                           - Return to primary care physician as previously                            scheduled. Procedure Code(s):        --- Professional ---                           [phone], Esophagogastroduodenoscopy, flexible,                            transoral; with biopsy, single or multiple Diagnosis Code(s):        --- Professional ---                           K22.70, Barrett's esophagus without dysplasia                           K22.89, Other specified disease of esophagus                           K44.9, Diaphragmatic hernia without obstruction or                            gangrene                           K31.89, Other diseases of stomach and duodenum CPT copyright 2022 [employer] Association. All rights reserved. The codes documented in this report are preliminary and upon coder review may  be revised to meet current compliance requirements. Deatrice Dine, MD Deatrice Dine, MD 06/24/2024 10:02:57 AM This report has been signed electronically. Number of Addenda: 0

## 2024-06-24 NOTE — Anesthesia Postprocedure Evaluation (Signed)
 Anesthesia Post Note  Patient: Zachary Taylor  Procedure(s) Performed: EGD (ESOPHAGOGASTRODUODENOSCOPY)  Patient location during evaluation: PACU Anesthesia Type: General Level of consciousness: awake and alert Pain management: pain level controlled Vital Signs Assessment: post-procedure vital signs reviewed and stable Respiratory status: spontaneous breathing, nonlabored ventilation, respiratory function stable and patient connected to nasal cannula oxygen Cardiovascular status: stable and blood pressure returned to baseline Postop Assessment: no apparent nausea or vomiting Anesthetic complications: no   No notable events documented.   Last Vitals:  Vitals:   06/24/24 1001 06/24/24 1009  BP: (!) 78/53 100/62  Pulse: 66 67  Resp: 18 16  Temp: 36.4 C   SpO2: 96%     Last Pain:  Vitals:   06/24/24 1001  TempSrc: Oral  PainSc: 0-No pain                 Andrea Limes

## 2024-06-24 NOTE — Discharge Instructions (Signed)

## 2024-06-24 NOTE — Interval H&P Note (Signed)
 History and Physical Interval Note:  06/24/2024 9:07 AM  Zachary Taylor  has presented today for surgery, with the diagnosis of barrett's esophagus.  The various methods of treatment have been discussed with the patient and family. After consideration of risks, benefits and other options for treatment, the patient has consented to  Procedure(s) with comments: EGD (ESOPHAGOGASTRODUODENOSCOPY) (N/A) - 10:00am, asa 1-2 as a surgical intervention.  The patient's history has been reviewed, patient examined, no change in status, stable for surgery.  I have reviewed the patient's chart and labs.  Questions were answered to the patient's satisfaction.     Zachary Taylor

## 2024-06-24 NOTE — Transfer of Care (Signed)
 Immediate Anesthesia Transfer of Care Note  Patient: Zachary Taylor  Procedure(s) Performed: EGD (ESOPHAGOGASTRODUODENOSCOPY)  Patient Location: Short Stay  Anesthesia Type:MAC  Level of Consciousness: drowsy  Airway & Oxygen Therapy: Patient Spontanous Breathing  Post-op Assessment: Report given to RN and Post -op Vital signs reviewed and stable  Post vital signs: Reviewed and stable  Last Vitals:  Vitals Value Taken Time  BP 80/56 06/24/24 10:06  Temp    Pulse 70 06/24/24 10:06  Resp 12 06/24/24 10:06  SpO2 94% on RA 06/24/24 10:06    Last Pain:  Vitals:   06/24/24 0942  TempSrc:   PainSc: 0-No pain         Complications: No notable events documented.

## 2024-06-24 NOTE — Anesthesia Preprocedure Evaluation (Addendum)
 Anesthesia Evaluation  Patient identified by MRN, date of birth, ID band Patient awake    Reviewed: Allergy & Precautions, H&P , NPO status , Patient's Chart, lab work & pertinent test results  Airway Mallampati: II  TM Distance: >3 FB Neck ROM: Full    Dental no notable dental hx.    Pulmonary neg pulmonary ROS   Pulmonary exam normal breath sounds clear to auscultation       Cardiovascular hypertension, negative cardio ROS Normal cardiovascular exam Rhythm:Regular Rate:Normal     Neuro/Psych negative neurological ROS  negative psych ROS   GI/Hepatic negative GI ROS, Neg liver ROS,,,  Endo/Other  negative endocrine ROS    Renal/GU negative Renal ROS  negative genitourinary   Musculoskeletal negative musculoskeletal ROS (+)    Abdominal   Peds negative pediatric ROS (+)  Hematology negative hematology ROS (+)   Anesthesia Other Findings   Reproductive/Obstetrics negative OB ROS                              Anesthesia Physical Anesthesia Plan  ASA: 2  Anesthesia Plan: General   Post-op Pain Management:    Induction: Intravenous  PONV Risk Score and Plan: Propofol  infusion  Airway Management Planned: Nasal Cannula  Additional Equipment:   Intra-op Plan:   Post-operative Plan:   Informed Consent: I have reviewed the patients History and Physical, chart, labs and discussed the procedure including the risks, benefits and alternatives for the proposed anesthesia with the patient or authorized representative who has indicated his/her understanding and acceptance.     Dental advisory given  Plan Discussed with: CRNA  Anesthesia Plan Comments:         Anesthesia Quick Evaluation

## 2024-06-28 ENCOUNTER — Encounter (HOSPITAL_COMMUNITY): Payer: Self-pay | Admitting: Gastroenterology

## 2024-06-29 LAB — SURGICAL PATHOLOGY

## 2024-07-01 ENCOUNTER — Ambulatory Visit (INDEPENDENT_AMBULATORY_CARE_PROVIDER_SITE_OTHER): Payer: Self-pay | Admitting: Gastroenterology

## 2024-07-06 DIAGNOSIS — H5789 Other specified disorders of eye and adnexa: Secondary | ICD-10-CM | POA: Diagnosis not present

## 2024-07-06 DIAGNOSIS — H02883 Meibomian gland dysfunction of right eye, unspecified eyelid: Secondary | ICD-10-CM | POA: Diagnosis not present

## 2024-07-06 DIAGNOSIS — H04123 Dry eye syndrome of bilateral lacrimal glands: Secondary | ICD-10-CM | POA: Diagnosis not present

## 2024-07-06 NOTE — Progress Notes (Signed)
 5 yr EGD noted in recall Patient result letter mailed procedure note and pathology result faxed to PCP

## 2024-07-16 ENCOUNTER — Telehealth (INDEPENDENT_AMBULATORY_CARE_PROVIDER_SITE_OTHER): Payer: Self-pay

## 2024-07-16 DIAGNOSIS — Z9889 Other specified postprocedural states: Secondary | ICD-10-CM | POA: Diagnosis not present

## 2024-07-16 DIAGNOSIS — R1010 Upper abdominal pain, unspecified: Secondary | ICD-10-CM | POA: Diagnosis not present

## 2024-07-16 DIAGNOSIS — R140 Abdominal distension (gaseous): Secondary | ICD-10-CM | POA: Diagnosis not present

## 2024-07-16 NOTE — Telephone Encounter (Signed)
 Patient having Right lower periumbilical pain for the last two - three days. Patient had recent Egd on 07/04/2024 preformed by Dr. Cinderella. Patient reports he went to the Urgent care this am due to abdominal pain and they told him to reach out to our office and did not give him any medications. Patient says he has right lower peri umbilical abdominal pain on going for the last two to three days, and his stomach is hard. He denies any diarrhea, gas, constipation, dark or bloody stools. Pain scale 4-5 out of 10. Says he is taking omeprazole  20 mg every am on an empty stomach. Next available appointment with Dr. Cinderella is scheduled out to July 27, 2024. Patient uses [facility] in [place] off of [address]. Please advise.

## 2024-07-16 NOTE — Telephone Encounter (Addendum)
 I spoke with the patient and made him aware per Charmaine Melia, Procedure was 3 weeks ago and I doubt this is procedure related and this appears to be new issue given no mention of pain at last office visit. He does have history of constipation and even though he denies constipation more details would be helpful but given Dr cinderella is not here and office is closing soon I dont believe there are any openings for him to be seen and if pain is severe I recommend ED evaluation. Hard stomach could mean obstruction and/or constipation. He can try miralax BID to see if additional Bms improve pain or as stated previously - if pain is severe then he should go to ED for imaging.  Patient states understanding and advised that the next available appointment is 07/27/2024 at 11:50 am with Dr. cinderella, and he did want this appointment. [name] has placed the patient on the schedule for this date and time. I did advise if worsening pain to go to the nearest Ed. Patient says he does not feel he needs to go to the Ed as his pain is not that severe. Patient states understanding and will be here for the appointment on 07/27/2024. Patient also states he took the Miralax last night but only a teaspoon full. He was made aware he need to take the capful as per the directions bid. Patient states understanding and will start incorporating this into his routine.

## 2024-07-27 ENCOUNTER — Encounter (INDEPENDENT_AMBULATORY_CARE_PROVIDER_SITE_OTHER): Payer: Self-pay | Admitting: Gastroenterology

## 2024-07-27 ENCOUNTER — Encounter: Payer: Self-pay | Admitting: Oncology

## 2024-07-27 ENCOUNTER — Ambulatory Visit (INDEPENDENT_AMBULATORY_CARE_PROVIDER_SITE_OTHER): Admitting: Gastroenterology

## 2024-07-27 ENCOUNTER — Encounter: Payer: Self-pay | Admitting: *Deleted

## 2024-07-27 VITALS — BP 117/66 | HR 90 | Temp 98.1°F | Ht 70.0 in | Wt 218.8 lb

## 2024-07-27 DIAGNOSIS — K760 Fatty (change of) liver, not elsewhere classified: Secondary | ICD-10-CM

## 2024-07-27 DIAGNOSIS — K5900 Constipation, unspecified: Secondary | ICD-10-CM

## 2024-07-27 DIAGNOSIS — K219 Gastro-esophageal reflux disease without esophagitis: Secondary | ICD-10-CM | POA: Diagnosis not present

## 2024-07-27 DIAGNOSIS — K2270 Barrett's esophagus without dysplasia: Secondary | ICD-10-CM | POA: Diagnosis not present

## 2024-07-27 DIAGNOSIS — R1013 Epigastric pain: Secondary | ICD-10-CM | POA: Insufficient documentation

## 2024-07-27 DIAGNOSIS — K449 Diaphragmatic hernia without obstruction or gangrene: Secondary | ICD-10-CM | POA: Diagnosis not present

## 2024-07-27 DIAGNOSIS — K2930 Chronic superficial gastritis without bleeding: Secondary | ICD-10-CM

## 2024-07-27 DIAGNOSIS — E611 Iron deficiency: Secondary | ICD-10-CM

## 2024-07-27 DIAGNOSIS — R109 Unspecified abdominal pain: Secondary | ICD-10-CM | POA: Diagnosis not present

## 2024-07-27 DIAGNOSIS — D500 Iron deficiency anemia secondary to blood loss (chronic): Secondary | ICD-10-CM

## 2024-07-27 DIAGNOSIS — K5904 Chronic idiopathic constipation: Secondary | ICD-10-CM

## 2024-07-27 NOTE — Patient Instructions (Signed)
 It was very nice to meet you today, as dicussed with will plan for the following :  1)Ensure adequate fluid intake: Aim for 8 glasses of water  daily. Follow a high fiber diet: Include foods such as dates, prunes, pears, and kiwi. Take Miralax twice a day for the first week, then reduce to once daily thereafter.  2) IB Guard 1-2 times daily  3) Ct abdomen and pelvis

## 2024-07-27 NOTE — Progress Notes (Signed)
 Zachary Taylor , M.D. Gastroenterology & Hepatology [facility] Gastroenterology [address] [place], [place] [address] Primary Care Physician: Albina GORMAN Dine, MD [address] [place] [address]  Chief Complaint:  GERD complicated with Barrett's esophagus, hepatic steatosis and chronic iron  deficiency, abdominal bloating /dyspepsia   History of Present Illness:  Zachary Taylor is a 73 y.o. male with history   of hypertension, iron  deficiency anemia who presents for evaluation of GERD complicated with Barrett's esophagus, hepatic steatosis and chronic iron  deficiency  Patient had upper endoscopy for Barrett's surveillance in 06/2024.  Patient reports that for the past couple week he has a regular bowel movements with abdominal discomfort.  Patient went to the urgent care recently as well.  He had tried MiraLAX for 2 to 3 days without much relief .Patient has been taking omeprazole  20 mg daily.  Has intermittent constipation Bristol stool scale bowel movement 2-3 with occasional straining .The patient denies having any nausea, vomiting, fever, chills, hematochezia, melena, hematemesis, abdominal distention, abdominal pain, diarrhea, jaundice, pruritus or weight loss.  Last labs from 09/2022 ferritin 14 TIBC 463 iron  saturation 15  Hemoglobin 14.3  Givens capsule study April 2023: normal Last Colonoscopy:09/26/21 - The entire examined colon is normal. - External hemorrhoids. - No specimens collected.  Suggest repeat 10 years Last Endoscopy: 06/2024  - Esophageal mucosal changes consistent with short- segment Barrett' s esophagus. Biopsied. - Z- line irregular, 35 cm from the incisors. - Esophagogastric landmarks identified. - 7 cm hiatal hernia. - Erythematous mucosa in the antrum. Biopsied. - Normal duodenal bulb and second portion of the duodenum.  A. STOMACH BIOPSY:  - Benign gastric mucosa with no specific pathologic change.  - No evidence of H.  pylori on HE stain.   B. ESOPHAGEAL BIOPSY:  - Squamous and gastric cardio-oxyntic mucosa with chronic inflammation.  - Negative for intestinal metaplasia or dysplasia.   Repeat colonoscopy 5 years    10/5/22Normal hypopharynx. - Normal esophagus. - Z-line irregular, 35 cm from the incisors. Biopsied-SSB - 7 cm hiatal hernia. - Gastritis. - Normal duodenal bulb and second portion of the duodenum.   Recommendations:  Repeat TCS 10 years  Path : A. GE JUNCTION, BIOPSY:  - Esophageal squamous and cardiac mucosa with intestinal metaplasia. See  comment  - Negative for dysplasia   COMMENT:   The findings are consistent with Barretts esophagus in an appropriate  clinical setting.   Past Medical History: Past Medical History:  Diagnosis Date   Hyperlipidemia    Hypertension     Past Surgical History: Past Surgical History:  Procedure Laterality Date   BIOPSY  09/26/2021   Procedure: BIOPSY;  Surgeon: Golda Claudis PENNER, MD;  [facility];  Service: Endoscopy;;   COLONOSCOPY WITH PROPOFOL  N/A 09/26/2021   Procedure: COLONOSCOPY WITH PROPOFOL ;  Surgeon: Golda Claudis PENNER, MD;  [facility];  Service: Endoscopy;  Laterality: N/A;  2:20   ESOPHAGOGASTRODUODENOSCOPY N/A 06/24/2024   Procedure: EGD (ESOPHAGOGASTRODUODENOSCOPY);  Surgeon: Dine Deatrice FALCON, MD;  [facility];  Service: Endoscopy;  Laterality: N/A;  10:00am, asa 1-2   ESOPHAGOGASTRODUODENOSCOPY (EGD) WITH PROPOFOL  N/A 09/26/2021   Procedure: ESOPHAGOGASTRODUODENOSCOPY (EGD) WITH PROPOFOL ;  Surgeon: Golda Claudis PENNER, MD;  [facility];  Service: Endoscopy;  Laterality: N/A;   GIVENS CAPSULE STUDY N/A 04/15/2022   Procedure: GIVENS CAPSULE STUDY;  Surgeon: Golda Claudis PENNER, MD;  [facility];  Service: Endoscopy;  Laterality: N/A;  730  Family History:History reviewed. No pertinent family history.  Social History: Social History   Tobacco Use  Smoking Status  Never  Smokeless Tobacco Never   Social History   Substance and Sexual Activity  Alcohol Use No   Social History   Substance and Sexual Activity  Drug Use No    Allergies: No Known Allergies  Medications: Current Outpatient Medications  Medication Sig Dispense Refill   amLODipine -olmesartan  (AZOR ) 5-40 MG tablet Take 1 tablet by mouth daily. 90 tablet 0   ferrous sulfate 325 (65 FE) MG EC tablet Take 325 mg by mouth daily with breakfast.     gabapentin  (NEURONTIN ) 300 MG capsule Take 1 capsule (300 mg total) by mouth 3 (three) times daily. 90 capsule 2   omeprazole  (PRILOSEC) 20 MG capsule Take 1 capsule (20 mg total) by mouth daily. 90 capsule 1   rosuvastatin  (CRESTOR ) 10 MG tablet Take 1 tablet (10 mg total) by mouth daily. 30 tablet 11   tadalafil  (CIALIS ) 20 MG tablet Take 1 tablet (20 mg total) by mouth daily as needed for erectile dysfunction. 30 tablet 2   No current facility-administered medications for this visit.    Review of Systems: GENERAL: negative for malaise, night sweats HEENT: No changes in hearing or vision, no nose bleeds or other nasal problems. NECK: Negative for lumps, goiter, pain and significant neck swelling RESPIRATORY: Negative for cough, wheezing CARDIOVASCULAR: Negative for chest pain, leg swelling, palpitations, orthopnea GI: SEE HPI MUSCULOSKELETAL: Negative for joint pain or swelling, back pain, and muscle pain. SKIN: Negative for lesions, rash HEMATOLOGY Negative for prolonged bleeding, bruising easily, and swollen nodes. ENDOCRINE: Negative for cold or heat intolerance, polyuria, polydipsia and goiter. NEURO: negative for tremor, gait imbalance, syncope and seizures. The remainder of the review of systems is noncontributory.   Physical Exam: BP 117/66   Pulse 90   Temp 98.1 F (36.7 C)   Ht 5' 10 (1.778 m)   Wt 218 lb 12.8 oz (99.2 kg)   BMI 31.39 kg/m  GENERAL: The patient is AO x3, in no acute distress. HEENT: Head is  normocephalic and atraumatic. EOMI are intact. Mouth is well hydrated and without lesions. NECK: Supple. No masses LUNGS: Clear to auscultation. No presence of rhonchi/wheezing/rales. Adequate chest expansion HEART: RRR, normal s1 and s2. ABDOMEN: Soft, nontender, no guarding, no peritoneal signs, and nondistended. BS +. No masses.  Imaging/Labs: as above     Latest Ref Rng & Units 06/21/2024   11:43 AM 06/09/2024    8:46 AM 10/15/2022    4:12 PM  CBC  WBC 4.0 - 10.5 K/uL 6.4  5.4  7.1   Hemoglobin 13.0 - 17.0 g/dL 86.2  85.3  85.6   Hematocrit 39.0 - 52.0 % 40.6  45.3  42.9   Platelets 150 - 400 K/uL 205  219  217    Lab Results  Component Value Date   IRON  75 06/21/2024   TIBC 473 (H) 06/21/2024   FERRITIN 28 06/21/2024    I personally reviewed and interpreted the available labs, imaging and endoscopic files.  #Ultrasound Narrative & Impression  CLINICAL DATA:  Upper abdominal pain, intermittent.   EXAM: ABDOMEN ULTRASOUND COMPLETE   COMPARISON:  None Available.   FINDINGS: Gallbladder: No gallstones or wall thickening visualized. No sonographic Murphy sign noted by sonographer.   Common bile duct: Diameter: 3.9 mm   Liver: Increased hepatic parenchymal echogenicity. No focal lesion. Portal vein is patent on color Doppler imaging with normal direction of  blood flow towards the liver.   IVC: No abnormality visualized.   Pancreas: Visualized portion unremarkable.   Spleen: Size and appearance within normal limits.   Right Kidney: Length: 12.5 cm. Echogenicity within normal limits. No mass or hydronephrosis visualized.   Left Kidney: Length: 12.1 cm. Echogenicity within normal limits. No mass or hydronephrosis visualized. Multiple parapelvic cysts measuring up to 2.3 cm.   Abdominal aorta: No aneurysm visualized.   Other findings: None.   IMPRESSION: 1. No cholelithiasis or sonographic evidence for acute cholecystitis. 2. Increased hepatic parenchymal  echogenicity suggestive of steatosis.       2023 IMPRESSION: 1. No cholelithiasis or sonographic evidence for acute cholecystitis. 2. Increased hepatic parenchymal echogenicity suggestive of steatosis.  Impression and Plan:  Kieron Kantner is a 73 y.o. male with history   of hypertension, iron  deficiency anemia who presents for evaluation of GERD complicated with Barrett's esophagus, hepatic steatosis and chronic iron  deficiency  # Abdominal discomfort  Patient has intermittent constipation likely in setting of medication use as he is on gabapentin  and oral iron   He has diffuse abdominal discomfort likely IBS-C  Although patient is very concerned and has been to urgent care recently for similar complaints  Ensure adequate fluid intake: Aim for 8 glasses of water  daily. Follow a high fiber diet: Include foods such as dates, prunes, pears, and kiwi. Take Miralax twice a day for the first week, then reduce to once daily thereafter. IBgard 1-2 times daily  Will obtain abdominal cross-sectional imaging given persistent discomfort  # GERD complicated with Barrett's esophagus  06/2024 surveillance colonoscopy upper endoscopy 2022 with Barrett esophagus without dysplasia  I discussed with patient in detail today the pathophysiology of Barrett's esophagus that this is considered premalignant lesion and recommend PPI to be continued indefinitely with surveillance endoscopy to evaluate for any brewing dysplasia  Recommend omeprazole  20 mg daily 30 minutes before breakfast to be continued indefinitely  Recommend upper endoscopy with [place] protocol in 5 years for surveillance    # Iron  deficiency without anemia  Last labs from 09/2022 ferritin 14 TIBC 463 iron  saturation 15 Hemoglobin 14.3  Patient had extensive workup for evaluation of iron  deficiency with negative bidirectional endoscopy and capsule endoscopy  Was previously following up with hematology at [facility] with IV iron   infusion  I recommend patient to continue to follow-up with hematology as last iron  profile consistent with iron  deficiency  Iron  deficiency could be due to [facility] lesion given large hiatal hernia ( 7cm), and mainstay treatment remains PPI and iron  supplementation . # Hepatic steatosis  Ultrasound with fatty liver but normal liver enzymes.  Mainstay treatment remains weight loss      - walking at a brisk pace/biking at moderate intesity 2.5-5 hours per week     - use pedometer/step counter to track activity     - goal to lose 5-10% of initial body weight     - avoid suagry drinks and juices, use zero calorie beverages     - increase water  intake     - eat a low carb diet with plenty of veggies and fruit     - Get sufficient sleep 7-8 hrs nightly     - maitain active lifestyle     - Counsel on lowering cholesterol by having a diet rich in vegetables,          protein (avoid red meats) and good fats(fish, salmon).   All questions were answered.      [place] Faizan  Zela Sobieski, MD Gastroenterology and Hepatology [facility] Gastroenterology   This chart has been completed using [facility] Dictation software, and while attempts have been made to ensure accuracy , certain words and phrases may not be transcribed as intended

## 2024-07-28 NOTE — Telephone Encounter (Signed)
 Left detailed message of CT appt and if any questions to call back

## 2024-07-29 NOTE — Telephone Encounter (Signed)
 Pt has been informed of CT appointment

## 2024-08-02 ENCOUNTER — Ambulatory Visit
Admission: RE | Admit: 2024-08-02 | Discharge: 2024-08-02 | Disposition: A | Discharge status: Home / Self Care | Source: Ambulatory Visit | Attending: Gastroenterology | Admitting: Gastroenterology

## 2024-08-02 DIAGNOSIS — R1013 Epigastric pain: Secondary | ICD-10-CM | POA: Diagnosis not present

## 2024-08-02 DIAGNOSIS — K449 Diaphragmatic hernia without obstruction or gangrene: Secondary | ICD-10-CM | POA: Diagnosis not present

## 2024-08-02 DIAGNOSIS — N400 Enlarged prostate without lower urinary tract symptoms: Secondary | ICD-10-CM | POA: Diagnosis not present

## 2024-08-02 DIAGNOSIS — K760 Fatty (change of) liver, not elsewhere classified: Secondary | ICD-10-CM | POA: Diagnosis not present

## 2024-08-02 MED ORDER — IOHEXOL 300 MG/ML  SOLN
100.0000 mL | Freq: Once | INTRAMUSCULAR | Status: AC | PRN
  Administered 2024-08-02 (×2): 100 mL via INTRAVENOUS

## 2024-08-09 ENCOUNTER — Ambulatory Visit (INDEPENDENT_AMBULATORY_CARE_PROVIDER_SITE_OTHER): Payer: Self-pay | Admitting: Gastroenterology

## 2024-08-09 NOTE — Progress Notes (Signed)
 I spoke to the patient over the phone  ==================  Hi Dr Albina   I had performed CT scan for abdominal distention found to have enlarged prostate and lung nodules . I will refer him to Urologist . May need repeat CT chest in 12 months for lung nodules  =================  Hi Ann ,  Can you please arrange a referral for this patient to Urologist [facility])   Diagnosis: Enlarged prostate   Thanks,  Ethridge Sollenberger Faizan Altair Appenzeller, MD Gastroenterology and Hepatology [facility] Gastroenterology  ===============================  IMPRESSION: No acute findings within the abdomen or pelvis.   Large hiatal hernia.   Mild hepatic steatosis.   Small fat-containing paraumbilical hernia.   Enlarged prostate, with marked median lobe hypertrophy indenting the bladder base.   Several tiny indeterminate right middle and lower lobe nodules measuring up to 4 mm. No follow-up needed if patient is low-risk (and has no known or suspected primary neoplasm). Non-contrast chest CT can be considered in 12 months if patient is high-risk. This recommendation follows the consensus statement: Guidelines for Management of Incidental Pulmonary Nodules Detected on CT Images: From the Fleischner Society 2017; Radiology 2017; 284:228-243.

## 2024-08-09 NOTE — Progress Notes (Signed)
 Referral has been placed for [facility] they will contact patient with apt

## 2024-08-10 NOTE — Progress Notes (Signed)
   08/10/2024  Patient ID: Zachary Taylor, male   DOB: 07-05-1951, 73 y.o.   MRN: [id number]  Pharmacy Quality Measure Review  This patient is appearing on a report for being at risk of failing the adherence measure for cholesterol (statin) medications this calendar year.   Medication: Rosuvastatin  Last fill date: 06/14/24 for 90 day supply  Medication: Amlodipine -Olmesartan  Last fill date: 06/24/24 90ds  Insurance report was not up to date. No action needed at this time.   [name], PharmD Clinical Pharmacist [phone]

## 2024-08-13 ENCOUNTER — Encounter: Payer: Self-pay | Admitting: Internal Medicine

## 2024-08-13 ENCOUNTER — Other Ambulatory Visit: Payer: Self-pay | Admitting: Internal Medicine

## 2024-08-13 DIAGNOSIS — R911 Solitary pulmonary nodule: Secondary | ICD-10-CM

## 2024-08-19 ENCOUNTER — Telehealth (INDEPENDENT_AMBULATORY_CARE_PROVIDER_SITE_OTHER): Payer: Self-pay | Admitting: Gastroenterology

## 2024-08-19 NOTE — Telephone Encounter (Signed)
 Pt left voicemail but I am not understanding what pt is saying/needing. He did call yesterday and I transferred him to [place] because I thought he was speaking about a referral. Pt hung up on Ann. Pt states in voicemail he would like to speak with Dr.Ahmed.

## 2024-08-19 NOTE — Progress Notes (Signed)
 08/26/24 1:47 PM   Karlene Amy Apr 04, 1951 [id number]  CC: enlarged prostate    HPI: 73 year old male here for initial evaluation of enlarged prostate Recent CT scan for abdominal distention per GI -incidentally noted enlarged prostate   - 60-70g+ prostate volume by CT - w/ significant intravesical median lobe protrusion   Today patient seems to be doing well.  He is a pleasant gentleman, some detailed aspects of his history were a bit challenging to understand with language barrier.  Overall it seems his baseline urinary habits are minimally bothersome. No prior urology issues, infections, nephrolithiasis, prostatitis  PSA 2.93 (2023) UA negative (06/18/2024)  Hx of ED on 20mg  Cialis  PRN  He does not recall any family history of GU malignancies  PMH: Past Medical History:  Diagnosis Date   Hyperlipidemia    Hypertension     Surgical History: Past Surgical History:  Procedure Laterality Date   BIOPSY  09/26/2021   Procedure: BIOPSY;  Surgeon: Golda Claudis PENNER, MD;  [facility];  Service: Endoscopy;;   COLONOSCOPY WITH PROPOFOL  N/A 09/26/2021   Procedure: COLONOSCOPY WITH PROPOFOL ;  Surgeon: Golda Claudis PENNER, MD;  [facility];  Service: Endoscopy;  Laterality: N/A;  2:20   ESOPHAGOGASTRODUODENOSCOPY N/A 06/24/2024   Procedure: EGD (ESOPHAGOGASTRODUODENOSCOPY);  Surgeon: Cinderella Deatrice FALCON, MD;  [facility];  Service: Endoscopy;  Laterality: N/A;  10:00am, asa 1-2   ESOPHAGOGASTRODUODENOSCOPY (EGD) WITH PROPOFOL  N/A 09/26/2021   Procedure: ESOPHAGOGASTRODUODENOSCOPY (EGD) WITH PROPOFOL ;  Surgeon: Golda Claudis PENNER, MD;  [facility];  Service: Endoscopy;  Laterality: N/A;   GIVENS CAPSULE STUDY N/A 04/15/2022   Procedure: GIVENS CAPSULE STUDY;  Surgeon: Golda Claudis PENNER, MD;  [facility];  Service: Endoscopy;  Laterality: N/A;  730    Family History: History reviewed. No pertinent family history.  Social History:   reports that he has never smoked. He has never used smokeless tobacco. He reports that he does not drink alcohol and does not use drugs.  Physical Exam: BP 102/65 (BP Location: Left Arm, Patient Position: Sitting, Cuff Size: Large)   Pulse (!) 114   Ht 5' 10 (1.778 m)   Wt 217 lb (98.4 kg)   BMI 31.14 kg/m    Constitutional:  Alert and oriented, No acute distress. Cardiovascular: No clubbing, cyanosis, or edema. Respiratory: Normal respiratory effort, no increased work of breathing. GI: Nondistended Skin: No rashes, bruises or suspicious lesions. Neurologic: Grossly intact, no focal deficits, moving all 4 extremities. Psychiatric: Normal mood and affect.  Laboratory Data:  Latest Reference Range & Units 10/15/22 16:12  PSA < OR = 4.00 ng/mL 2.93     Pertinent Imaging: N/A   Assessment & Plan:    BPH with obstruction/lower urinary tract symptoms Assessment & Plan: BPH w/ minimal LUTS  60-70g+ prostate w/ protrusive median lobe - by CT IPSS 3/1 today PVR 13 cc  Today we reviewed the physiology and common causes of male lower urinary tract symptoms (LUTS). Discussed potential etiologies including infectious, inflammatory, bladder-related, benign prostatic hyperplasia (BPH), and musculoskeletal/pelvic floor contributions. Reviewed the standard diagnostic workup (urinalysis, PVR, uroflow, prostate assessment, possible cystoscopy or imaging) and the spectrum of initial management strategies ranging from behavioral and lifestyle measures to pharmacologic therapy, with procedural options if indicated. All questions were addressed and the patient expressed understanding of the evaluation and treatment pathway.   - patient is minimally bothered by urinary habits today. I explained his CT findings, although in minimally or  asymptomatic men, we do not need to treat aggressively.  - clinic info provided for return visit if symptoms change - PRN follow up for now  Orders: -     Bladder  Scan (Post Void Residual) in office      Penne Skye, MD 08/26/2024  [facility] Urology [address] [place], [place] [address] [phone]

## 2024-08-19 NOTE — Telephone Encounter (Signed)
 Noted! Thank you

## 2024-08-19 NOTE — Assessment & Plan Note (Addendum)
 BPH w/ minimal LUTS  60-70g+ prostate w/ protrusive median lobe - by CT IPSS 3/1 today PVR 13 cc  Today we reviewed the physiology and common causes of male lower urinary tract symptoms (LUTS). Discussed potential etiologies including infectious, inflammatory, bladder-related, benign prostatic hyperplasia (BPH), and musculoskeletal/pelvic floor contributions. Reviewed the standard diagnostic workup (urinalysis, PVR, uroflow, prostate assessment, possible cystoscopy or imaging) and the spectrum of initial management strategies ranging from behavioral and lifestyle measures to pharmacologic therapy, with procedural options if indicated. All questions were addressed and the patient expressed understanding of the evaluation and treatment pathway.   - patient is minimally bothered by urinary habits today. I explained his CT findings, although in minimally or asymptomatic men, we do not need to treat aggressively.  - clinic info provided for return visit if symptoms change - PRN follow up for now

## 2024-08-19 NOTE — Telephone Encounter (Signed)
 Hi   I spoke to the patient , he was inquiring about his urology referral .I have ensured him that the referral is in system

## 2024-08-24 NOTE — Telephone Encounter (Signed)
 Patient is schd 08/26/24 with urology

## 2024-08-26 ENCOUNTER — Ambulatory Visit (INDEPENDENT_AMBULATORY_CARE_PROVIDER_SITE_OTHER): Admitting: Urology

## 2024-08-26 ENCOUNTER — Encounter: Payer: Self-pay | Admitting: Urology

## 2024-08-26 VITALS — BP 102/65 | HR 114 | Ht 70.0 in | Wt 217.0 lb

## 2024-08-26 DIAGNOSIS — N138 Other obstructive and reflux uropathy: Secondary | ICD-10-CM | POA: Diagnosis not present

## 2024-08-26 DIAGNOSIS — N401 Enlarged prostate with lower urinary tract symptoms: Secondary | ICD-10-CM | POA: Diagnosis not present

## 2024-08-26 LAB — BLADDER SCAN AMB NON-IMAGING

## 2024-09-06 ENCOUNTER — Telehealth (INDEPENDENT_AMBULATORY_CARE_PROVIDER_SITE_OTHER): Payer: Self-pay | Admitting: Gastroenterology

## 2024-09-06 NOTE — Telephone Encounter (Signed)
 I called the patient and left a voicemail. If you can call the patient again tomorrow and advise that urologist testing results to be followed up with the urologist.

## 2024-09-06 NOTE — Telephone Encounter (Signed)
 Pt left voicemail. Returned call to pt. Pt seen urology on 08/26/24 and stated that urologist stated everything was ok but Dr.Ahmed never call to tell him or to check on anything. Urology note is in Epic. Please advise. Thank you.

## 2024-09-06 NOTE — Telephone Encounter (Signed)
 Noted. Will call pt tomorrow 09/07/24

## 2024-09-07 NOTE — Telephone Encounter (Signed)
 Pt contacted and verbalized understanding.

## 2024-09-08 ENCOUNTER — Ambulatory Visit (INDEPENDENT_AMBULATORY_CARE_PROVIDER_SITE_OTHER): Admitting: Internal Medicine

## 2024-09-08 VITALS — BP 120/62 | HR 84 | Ht 70.0 in | Wt 214.0 lb

## 2024-09-08 DIAGNOSIS — E782 Mixed hyperlipidemia: Secondary | ICD-10-CM

## 2024-09-08 DIAGNOSIS — I10 Essential (primary) hypertension: Secondary | ICD-10-CM

## 2024-09-08 DIAGNOSIS — M5416 Radiculopathy, lumbar region: Secondary | ICD-10-CM | POA: Diagnosis not present

## 2024-09-08 DIAGNOSIS — N528 Other male erectile dysfunction: Secondary | ICD-10-CM | POA: Diagnosis not present

## 2024-09-08 MED ORDER — GABAPENTIN 300 MG PO CAPS: 300.0000 mg | ORAL_CAPSULE | Freq: Three times a day (TID) | ORAL | 1 refills | Status: AC

## 2024-09-08 MED ORDER — AMLODIPINE-OLMESARTAN 5-40 MG PO TABS: 1.0000 | ORAL_TABLET | Freq: Every day | ORAL | 1 refills | Status: AC

## 2024-09-08 MED ORDER — TADALAFIL 20 MG PO TABS: 20.0000 mg | ORAL_TABLET | Freq: Every day | ORAL | 1 refills | Status: AC | PRN

## 2024-09-08 NOTE — Progress Notes (Signed)
 d  Established Patient Office Visit  Subjective:  Patient ID: Zachary Taylor, male    DOB: Feb 04, 1951  Age: 73 y.o. MRN: [id number]  Chief Complaint  Patient presents with   Follow-up    3 month follow up    No new complaints, here for lab review and medication refills. Failed to have previsit labs done.     No other concerns at this time.   Past Medical History:  Diagnosis Date   Hyperlipidemia    Hypertension     Past Surgical History:  Procedure Laterality Date   BIOPSY  09/26/2021   Procedure: BIOPSY;  Surgeon: Golda Claudis PENNER, MD;  Location: [facility];  Service: Endoscopy;;   COLONOSCOPY WITH PROPOFOL  N/A 09/26/2021   Procedure: COLONOSCOPY WITH PROPOFOL ;  Surgeon: Golda Claudis PENNER, MD;  Location: [facility];  Service: Endoscopy;  Laterality: N/A;  2:20   ESOPHAGOGASTRODUODENOSCOPY N/A 06/24/2024   Procedure: EGD (ESOPHAGOGASTRODUODENOSCOPY);  Surgeon: Cinderella Deatrice FALCON, MD;  Location: [facility];  Service: Endoscopy;  Laterality: N/A;  10:00am, asa 1-2   ESOPHAGOGASTRODUODENOSCOPY (EGD) WITH PROPOFOL  N/A 09/26/2021   Procedure: ESOPHAGOGASTRODUODENOSCOPY (EGD) WITH PROPOFOL ;  Surgeon: Golda Claudis PENNER, MD;  Location: [facility];  Service: Endoscopy;  Laterality: N/A;   GIVENS CAPSULE STUDY N/A 04/15/2022   Procedure: GIVENS CAPSULE STUDY;  Surgeon: Golda Claudis PENNER, MD;  Location: [facility];  Service: Endoscopy;  Laterality: N/A;  730    Social History   Socioeconomic History   Marital status: Married    Spouse name: Not on file   Number of children: Not on file   Years of education: Not on file   Highest education level: Not on file  Occupational History   Not on file  Tobacco Use   Smoking status: Never   Smokeless tobacco: Never  Vaping Use   Vaping status: Never Used  Substance and Sexual Activity   Alcohol use: No   Drug use: No   Sexual activity: Not on file  Other Topics Concern   Not on file  Social History Narrative   Not on file    Social Drivers of Health   Financial Resource Strain: Not on file  Food Insecurity: No Food Insecurity (06/21/2024)   Hunger Vital Sign    Worried About Running Out of Food in the Last Year: Never true    Ran Out of Food in the Last Year: Never true  Transportation Needs: No Transportation Needs (06/21/2024)   PRAPARE - Administrator, Civil Service (Medical): No    Lack of Transportation (Non-Medical): No  Physical Activity: Not on file  Stress: Not on file  Social Connections: Not on file  Intimate Partner Violence: Not At Risk (06/21/2024)   Humiliation, Afraid, Rape, and Kick questionnaire    Fear of Current or Ex-Partner: No    Emotionally Abused: No    Physically Abused: No    Sexually Abused: No    No family history on file.  No Known Allergies  Outpatient Medications Prior to Visit  Medication Sig   ferrous sulfate 325 (65 FE) MG EC tablet Take 325 mg by mouth daily with breakfast.   omeprazole  (PRILOSEC) 20 MG capsule Take 1 capsule (20 mg total) by mouth daily.   rosuvastatin  (CRESTOR ) 10 MG tablet Take 1 tablet (10 mg total) by mouth daily.   [DISCONTINUED] amLODipine -olmesartan  (AZOR ) 5-40 MG tablet Take 1 tablet by mouth daily.   [DISCONTINUED] gabapentin  (NEURONTIN ) 300 MG capsule Take  1 capsule (300 mg total) by mouth 3 (three) times daily.   [DISCONTINUED] tadalafil  (CIALIS ) 20 MG tablet Take 1 tablet (20 mg total) by mouth daily as needed for erectile dysfunction.   No facility-administered medications prior to visit.    Review of Systems  Constitutional:  Positive for weight loss (3 lb).  HENT: Negative.    Eyes: Negative.   Respiratory: Negative.    Cardiovascular: Negative.   Gastrointestinal: Negative.   Genitourinary: Negative.   Skin: Negative.   Neurological: Negative.   Endo/Heme/Allergies: Negative.        Objective:   BP 120/62   Pulse 84   Ht 5' 10 (1.778 m)   Wt 214 lb (97.1 kg)   SpO2 96%   BMI 30.71 kg/m    Vitals:   09/08/24 1040  BP: 120/62  Pulse: 84  Height: 5' 10 (1.778 m)  Weight: 214 lb (97.1 kg)  SpO2: 96%  BMI (Calculated): 30.71    Physical Exam Vitals reviewed.  Constitutional:      Appearance: Normal appearance. He is obese.  HENT:     Head: Normocephalic.     Left Ear: There is no impacted cerumen.     Nose: Nose normal.     Mouth/Throat:     Mouth: Mucous membranes are moist.     Pharynx: No posterior oropharyngeal erythema.  Eyes:     Extraocular Movements: Extraocular movements intact.     Pupils: Pupils are equal, round, and reactive to light.  Cardiovascular:     Rate and Rhythm: Regular rhythm.     Chest Wall: PMI is not displaced.     Pulses: Normal pulses.     Heart sounds: Normal heart sounds. No murmur heard. Pulmonary:     Effort: Pulmonary effort is normal.     Breath sounds: Normal air entry. No rhonchi or rales.  Abdominal:     General: Abdomen is flat. Bowel sounds are normal. There is no distension.     Palpations: Abdomen is soft. There is no hepatomegaly, splenomegaly or mass.     Tenderness: There is no abdominal tenderness.  Musculoskeletal:        General: Normal range of motion.     Cervical back: Normal range of motion and neck supple.     Right lower leg: No edema.     Left lower leg: No edema.  Skin:    General: Skin is warm and dry.  Neurological:     General: No focal deficit present.     Mental Status: He is alert and oriented to person, place, and time.     Cranial Nerves: No cranial nerve deficit.     Motor: No weakness.  Psychiatric:        Mood and Affect: Mood normal.        Behavior: Behavior normal.      No results found for any visits on 09/08/24.      Assessment & Plan:  Zachary Taylor was seen today for follow-up.  Mixed hyperlipidemia -     Comprehensive metabolic panel with GFR -     Lipid panel  Essential hypertension -     amLODIPine -Olmesartan ; Take 1 tablet by mouth daily.  Dispense: 90 tablet;  Refill: 1  Other male erectile dysfunction -     Tadalafil ; Take 1 tablet (20 mg total) by mouth daily as needed for erectile dysfunction.  Dispense: 90 tablet; Refill: 1  Lumbar radiculopathy -     Gabapentin ; Take 1  capsule (300 mg total) by mouth 3 (three) times daily.  Dispense: 270 capsule; Refill: 1    Problem List Items Addressed This Visit       Cardiovascular and Mediastinum   Essential hypertension   Relevant Medications   amLODipine -olmesartan  (AZOR ) 5-40 MG tablet   tadalafil  (CIALIS ) 20 MG tablet     Other   Erectile dysfunction   Relevant Medications   tadalafil  (CIALIS ) 20 MG tablet   Other Visit Diagnoses       Mixed hyperlipidemia    -  Primary   Relevant Medications   amLODipine -olmesartan  (AZOR ) 5-40 MG tablet   tadalafil  (CIALIS ) 20 MG tablet   Other Relevant Orders   Comprehensive metabolic panel with GFR   Lipid panel     Lumbar radiculopathy       Relevant Medications   gabapentin  (NEURONTIN ) 300 MG capsule       Return in about 6 months (around 03/08/2025) for fu with labs prior.   Total time spent: 20 minutes  Sherrill Cinderella Perry, MD  09/08/2024   This document may have been prepared by [facility] Voice Recognition software and as such may include unintentional dictation errors.

## 2024-09-09 ENCOUNTER — Other Ambulatory Visit

## 2024-09-09 DIAGNOSIS — E782 Mixed hyperlipidemia: Secondary | ICD-10-CM | POA: Diagnosis not present

## 2024-09-10 ENCOUNTER — Ambulatory Visit: Payer: Self-pay | Admitting: Internal Medicine

## 2024-09-10 LAB — COMPREHENSIVE METABOLIC PANEL WITH GFR
ALT: 17 IU/L (ref 0–44)
AST: 22 IU/L (ref 0–40)
Albumin: 4.4 g/dL (ref 3.8–4.8)
Alkaline Phosphatase: 62 IU/L (ref 47–123)
BUN/Creatinine Ratio: 22 (ref 10–24)
BUN: 27 mg/dL (ref 8–27)
Bilirubin Total: 1.1 mg/dL (ref 0.0–1.2)
CO2: 21 mmol/L (ref 20–29)
Calcium: 9.3 mg/dL (ref 8.6–10.2)
Chloride: 103 mmol/L (ref 96–106)
Creatinine, Ser: 1.25 mg/dL (ref 0.76–1.27)
Globulin, Total: 2.6 g/dL (ref 1.5–4.5)
Glucose: 122 mg/dL — ABNORMAL HIGH (ref 70–99)
Potassium: 4.8 mmol/L (ref 3.5–5.2)
Sodium: 137 mmol/L (ref 134–144)
Total Protein: 7 g/dL (ref 6.0–8.5)
eGFR: 62 mL/min/1.73 (ref >59)

## 2024-09-10 LAB — LIPID PANEL
Chol/HDL Ratio: 3.2 ratio (ref 0.0–5.0)
Cholesterol, Total: 110 mg/dL (ref 100–199)
HDL: 34 mg/dL — ABNORMAL LOW (ref >39)
LDL Chol Calc (NIH): 50 mg/dL (ref 0–99)
Triglycerides: 153 mg/dL — ABNORMAL HIGH (ref 0–149)
VLDL Cholesterol Cal: 26 mg/dL (ref 5–40)

## 2024-09-13 ENCOUNTER — Ambulatory Visit: Admitting: Urology

## 2024-09-15 ENCOUNTER — Ambulatory Visit

## 2024-09-15 ENCOUNTER — Telehealth: Payer: Self-pay | Admitting: Internal Medicine

## 2024-09-15 NOTE — Progress Notes (Signed)
 yes

## 2024-09-15 NOTE — Telephone Encounter (Signed)
 Patient left VM. Sounded like he had questions about his chest CT but I couldn't understand the voicemail very well.

## 2024-09-17 ENCOUNTER — Other Ambulatory Visit: Payer: Self-pay

## 2024-09-21 ENCOUNTER — Telehealth: Payer: Self-pay

## 2024-09-21 NOTE — Telephone Encounter (Signed)
 Pt states his 3 month supply of amlodipine  is $700. Pt is requesting to be changed back to losartan  125 mg as that is cheaper for him.

## 2024-09-21 NOTE — Progress Notes (Signed)
Pt informed

## 2024-09-27 NOTE — Telephone Encounter (Signed)
 Resolved -ao

## 2024-09-28 ENCOUNTER — Telehealth: Payer: Self-pay

## 2024-09-28 NOTE — Progress Notes (Signed)
   09/28/2024  Patient ID: Zachary Taylor, male   DOB: 24-Sep-1951, 73 y.o.   MRN: [id number]  Pharmacy Quality Measure Review  This patient is appearing on a report for being at risk of failing the adherence measure for cholesterol (statin) medications this calendar year.   Medication: Rosuvastatin  Last fill date: 06/14/24 for 90 day supply  Medication: Amlodipine -Olmesartan  Last fill date: 09/08/24 90ds  Contacted patient to discuss rosuvastatin . Reports he stopped taking, tired of taking too many pills. Denied having any side effect concerns. Attempted to discuss importance of taking medication and restarting med, patient interrupted to say no and disconnected the call.  [name], PharmD Clinical Pharmacist [phone]

## 2024-10-12 ENCOUNTER — Encounter: Payer: Self-pay | Admitting: Internal Medicine

## 2024-10-12 DIAGNOSIS — R911 Solitary pulmonary nodule: Secondary | ICD-10-CM

## 2024-11-08 ENCOUNTER — Other Ambulatory Visit (INDEPENDENT_AMBULATORY_CARE_PROVIDER_SITE_OTHER): Payer: Self-pay | Admitting: Gastroenterology

## 2024-11-08 DIAGNOSIS — R21 Rash and other nonspecific skin eruption: Secondary | ICD-10-CM

## 2024-11-27 DIAGNOSIS — H40033 Anatomical narrow angle, bilateral: Secondary | ICD-10-CM | POA: Diagnosis not present

## 2024-11-27 DIAGNOSIS — H2513 Age-related nuclear cataract, bilateral: Secondary | ICD-10-CM | POA: Diagnosis not present

## 2024-12-14 ENCOUNTER — Other Ambulatory Visit: Payer: Self-pay | Admitting: *Deleted

## 2024-12-14 DIAGNOSIS — D509 Iron deficiency anemia, unspecified: Secondary | ICD-10-CM

## 2024-12-20 ENCOUNTER — Inpatient Hospital Stay: Admitting: Oncology

## 2024-12-20 ENCOUNTER — Inpatient Hospital Stay: Attending: Internal Medicine

## 2024-12-20 ENCOUNTER — Telehealth: Payer: Self-pay | Admitting: Oncology

## 2024-12-20 NOTE — Telephone Encounter (Signed)
 I called the pt to see if he wanted to reschedule his missed appts on 12/29 and the pt states he has never seen Dr. Jacobo and he doesn't want to reschedule his appts.

## 2025-01-21 ENCOUNTER — Ambulatory Visit: Admitting: Internal Medicine

## 2025-01-21 ENCOUNTER — Encounter: Payer: Self-pay | Admitting: Oncology

## 2025-01-27 ENCOUNTER — Telehealth (INDEPENDENT_AMBULATORY_CARE_PROVIDER_SITE_OTHER): Payer: Self-pay | Admitting: Gastroenterology

## 2025-01-27 NOTE — Telephone Encounter (Signed)
 I returned patient's call from this morning to let him know that I did not have Dr Orion schedule for March yet to make his  6 month follow up,  but he cut me off short saying he had talked to Dr Cinderella and didn't need to speak with me.

## 2025-03-08 ENCOUNTER — Ambulatory Visit: Admitting: Internal Medicine
